# Patient Record
Sex: Male | Born: 1935 | Race: White | Hispanic: No | Marital: Married | Smoking: Former smoker
Health system: Southern US, Community
[De-identification: ages and names within clinical notes are randomized; demographics above are authoritative.]

## PROBLEM LIST (undated history)

## (undated) DIAGNOSIS — R0600 Dyspnea, unspecified: Secondary | ICD-10-CM

## (undated) DIAGNOSIS — R030 Elevated blood-pressure reading, without diagnosis of hypertension: Secondary | ICD-10-CM

## (undated) DIAGNOSIS — Z972 Presence of dental prosthetic device (complete) (partial): Secondary | ICD-10-CM

## (undated) DIAGNOSIS — R351 Nocturia: Secondary | ICD-10-CM

## (undated) DIAGNOSIS — Z9049 Acquired absence of other specified parts of digestive tract: Secondary | ICD-10-CM

## (undated) DIAGNOSIS — R197 Diarrhea, unspecified: Secondary | ICD-10-CM

## (undated) DIAGNOSIS — H9313 Tinnitus, bilateral: Secondary | ICD-10-CM

## (undated) DIAGNOSIS — J302 Other seasonal allergic rhinitis: Secondary | ICD-10-CM

## (undated) DIAGNOSIS — I2510 Atherosclerotic heart disease of native coronary artery without angina pectoris: Secondary | ICD-10-CM

## (undated) DIAGNOSIS — E785 Hyperlipidemia, unspecified: Secondary | ICD-10-CM

## (undated) DIAGNOSIS — C61 Malignant neoplasm of prostate: Secondary | ICD-10-CM

## (undated) DIAGNOSIS — I6523 Occlusion and stenosis of bilateral carotid arteries: Secondary | ICD-10-CM

## (undated) DIAGNOSIS — K9189 Other postprocedural complications and disorders of digestive system: Secondary | ICD-10-CM

## (undated) DIAGNOSIS — Z973 Presence of spectacles and contact lenses: Secondary | ICD-10-CM

## (undated) DIAGNOSIS — T8859XA Other complications of anesthesia, initial encounter: Secondary | ICD-10-CM

## (undated) DIAGNOSIS — N401 Enlarged prostate with lower urinary tract symptoms: Secondary | ICD-10-CM

## (undated) DIAGNOSIS — R011 Cardiac murmur, unspecified: Secondary | ICD-10-CM

## (undated) DIAGNOSIS — K219 Gastro-esophageal reflux disease without esophagitis: Secondary | ICD-10-CM

## (undated) DIAGNOSIS — N183 Chronic kidney disease, stage 3 unspecified: Secondary | ICD-10-CM

## (undated) DIAGNOSIS — Z8679 Personal history of other diseases of the circulatory system: Secondary | ICD-10-CM

## (undated) DIAGNOSIS — N529 Male erectile dysfunction, unspecified: Secondary | ICD-10-CM

## (undated) DIAGNOSIS — J189 Pneumonia, unspecified organism: Secondary | ICD-10-CM

## (undated) HISTORY — PX: COLONOSCOPY: SHX174

## (undated) HISTORY — PX: PROSTATE BIOPSY: SHX241

## (undated) HISTORY — DX: Chronic kidney disease, stage 3 (moderate): N18.3

## (undated) HISTORY — DX: Acquired absence of other specified parts of digestive tract: Z90.49

## (undated) HISTORY — DX: Chronic kidney disease, stage 3 unspecified: N18.30

## (undated) HISTORY — DX: Tinnitus, bilateral: H93.13

## (undated) HISTORY — DX: Male erectile dysfunction, unspecified: N52.9

## (undated) HISTORY — DX: Hyperlipidemia, unspecified: E78.5

## (undated) HISTORY — DX: Other seasonal allergic rhinitis: J30.2

## (undated) HISTORY — DX: Diarrhea, unspecified: R19.7

## (undated) HISTORY — PX: LAPAROSCOPIC CHOLECYSTECTOMY: SUR755

## (undated) HISTORY — DX: Other postprocedural complications and disorders of digestive system: K91.89

## (undated) HISTORY — PX: CATARACT EXTRACTION W/ INTRAOCULAR LENS  IMPLANT, BILATERAL: SHX1307

---

## 2003-01-18 ENCOUNTER — Encounter: Payer: Self-pay | Admitting: Emergency Medicine

## 2003-01-18 ENCOUNTER — Inpatient Hospital Stay (HOSPITAL_COMMUNITY): Admission: EM | Admit: 2003-01-18 | Discharge: 2003-01-19 | Payer: Self-pay | Admitting: Emergency Medicine

## 2003-01-18 ENCOUNTER — Encounter: Payer: Self-pay | Admitting: Surgery

## 2003-02-17 ENCOUNTER — Encounter: Payer: Self-pay | Admitting: Surgery

## 2003-02-17 ENCOUNTER — Ambulatory Visit (HOSPITAL_COMMUNITY): Admission: RE | Admit: 2003-02-17 | Discharge: 2003-02-17 | Payer: Self-pay | Admitting: Surgery

## 2011-11-23 ENCOUNTER — Other Ambulatory Visit: Payer: Self-pay | Admitting: Dermatology

## 2016-02-02 ENCOUNTER — Other Ambulatory Visit: Payer: Self-pay | Admitting: Internal Medicine

## 2016-02-02 DIAGNOSIS — R0989 Other specified symptoms and signs involving the circulatory and respiratory systems: Secondary | ICD-10-CM

## 2016-02-11 ENCOUNTER — Ambulatory Visit
Admission: RE | Admit: 2016-02-11 | Discharge: 2016-02-11 | Disposition: A | Discharge status: Home / Self Care | Payer: Medicare Other | Source: Ambulatory Visit | Attending: Internal Medicine | Admitting: Internal Medicine

## 2016-02-11 DIAGNOSIS — R0989 Other specified symptoms and signs involving the circulatory and respiratory systems: Secondary | ICD-10-CM

## 2016-11-17 ENCOUNTER — Other Ambulatory Visit: Payer: Self-pay | Admitting: Urology

## 2016-11-17 ENCOUNTER — Encounter: Payer: Self-pay | Admitting: Radiation Oncology

## 2016-11-17 DIAGNOSIS — C61 Malignant neoplasm of prostate: Secondary | ICD-10-CM

## 2016-11-24 ENCOUNTER — Telehealth: Payer: Self-pay | Admitting: Medical Oncology

## 2016-11-24 NOTE — Telephone Encounter (Signed)
Mrs. Cederberg called 11/23/16 with questions about treatment for prostate cancer, speaking with Luberta Mutter. I was out of the office and I returned the call today. Her husband has been diagnosed with prostate cancer and referred to Dr. Tammi Klippel. Her husband is scheduled for a PET scan and he is very anxious of the unknown. She drove to [facility] radiation department to ask about the scan and was able to tour the scanner. She states after seeing the scanner, she feels he will be able to proceed with the PET. She had questions about gold marker placement and radiation. All questions were answered and I asked her to call me with further concerns or questions. She voiced understanding.

## 2016-12-06 ENCOUNTER — Encounter (HOSPITAL_COMMUNITY)
Admission: RE | Admit: 2016-12-06 | Discharge: 2016-12-06 | Disposition: A | Discharge status: Home / Self Care | Payer: Medicare Other | Source: Ambulatory Visit | Attending: Urology | Admitting: Urology

## 2016-12-06 ENCOUNTER — Encounter: Payer: Self-pay | Admitting: Radiation Oncology

## 2016-12-06 DIAGNOSIS — C61 Malignant neoplasm of prostate: Secondary | ICD-10-CM | POA: Diagnosis not present

## 2016-12-06 MED ORDER — AXUMIN (FLUCICLOVINE F 18) INJECTION
9.9000 | Freq: Once | INTRAVENOUS | Status: AC
  Administered 2016-12-06: 9.9 via INTRAVENOUS

## 2016-12-06 NOTE — Progress Notes (Signed)
GU Location of Tumor / Histology: prostatic adenocarcinoma  If Prostate Cancer, Gleason Score is (4 + 3) and PSA is (10.1). Prostate volume: 98 grams.   Edrees Valent was referred by Dr. Lavone Orn to Dr. Irine Seal for evaluation of an elevated PSA. His last PSA was performed 01/28/16. The last PSA value was 7.26. Patient reports that Dr. Lavone Orn noted his PSA to be elevated 4 years ago (3.5).   Biopsies of prostate (if applicable) revealed:    Past/Anticipated interventions by urology, if any: prostate biopsy, referral to Dr. Tammi Klippel to discuss EBRT, discussion about ADT (MM to make final decision)  Past/Anticipated interventions by medical oncology, if any: no  Weight changes, if any: no  Bowel/Bladder complaints, if any: IPSS 9. Denies dysuria, hematuria, or leakage  Nausea/Vomiting, if any: no  Pain issues, if any:  Statins cause achy muscles  SAFETY ISSUES:  Prior radiation? no  Pacemaker/ICD? no  Possible current pregnancy? no  Is the patient on methotrexate? no  Current Complaints / other details:  83 year old male. Married. Retired. Lives in [place].

## 2016-12-08 ENCOUNTER — Encounter: Payer: Self-pay | Admitting: Medical Oncology

## 2016-12-08 ENCOUNTER — Ambulatory Visit
Admission: RE | Admit: 2016-12-08 | Discharge: 2016-12-08 | Disposition: A | Discharge status: Home / Self Care | Payer: Medicare Other | Source: Ambulatory Visit | Attending: Radiation Oncology | Admitting: Radiation Oncology

## 2016-12-08 ENCOUNTER — Encounter: Payer: Self-pay | Admitting: Radiation Oncology

## 2016-12-08 VITALS — BP 161/96 | HR 57 | Temp 98.0°F | Resp 16 | Ht 71.0 in | Wt 178.7 lb

## 2016-12-08 DIAGNOSIS — N183 Chronic kidney disease, stage 3 (moderate): Secondary | ICD-10-CM | POA: Diagnosis not present

## 2016-12-08 DIAGNOSIS — I129 Hypertensive chronic kidney disease with stage 1 through stage 4 chronic kidney disease, or unspecified chronic kidney disease: Secondary | ICD-10-CM | POA: Diagnosis not present

## 2016-12-08 DIAGNOSIS — Z87891 Personal history of nicotine dependence: Secondary | ICD-10-CM | POA: Insufficient documentation

## 2016-12-08 DIAGNOSIS — R197 Diarrhea, unspecified: Secondary | ICD-10-CM | POA: Diagnosis not present

## 2016-12-08 DIAGNOSIS — K219 Gastro-esophageal reflux disease without esophagitis: Secondary | ICD-10-CM | POA: Diagnosis not present

## 2016-12-08 DIAGNOSIS — Z510 Encounter for antineoplastic radiation therapy: Secondary | ICD-10-CM | POA: Diagnosis present

## 2016-12-08 DIAGNOSIS — N529 Male erectile dysfunction, unspecified: Secondary | ICD-10-CM | POA: Insufficient documentation

## 2016-12-08 DIAGNOSIS — E785 Hyperlipidemia, unspecified: Secondary | ICD-10-CM | POA: Diagnosis not present

## 2016-12-08 DIAGNOSIS — C61 Malignant neoplasm of prostate: Secondary | ICD-10-CM | POA: Insufficient documentation

## 2016-12-08 DIAGNOSIS — K5730 Diverticulosis of large intestine without perforation or abscess without bleeding: Secondary | ICD-10-CM | POA: Insufficient documentation

## 2016-12-08 HISTORY — DX: Malignant neoplasm of prostate: C61

## 2016-12-08 NOTE — Addendum Note (Signed)
Encounter addended by: Heywood Footman, RN on: 12/08/2016 11:04 AM<BR>    Actions taken: Sign clinical note

## 2016-12-08 NOTE — Progress Notes (Signed)
See progress note under physician encounter. 

## 2016-12-08 NOTE — Progress Notes (Signed)
Introduced myself to Mr. Rodney Patterson and his family as the prostate nurse navigator and my role. I had spoke with [name] in July concerning questions about the PET and treatments. He is here today to discuss his radiation treatment options with Dr. Tammi Klippel. I will continue to follow and encouraged them to call with questions or concerns.

## 2016-12-08 NOTE — Progress Notes (Signed)
Radiation Oncology         [phone] ________________________________  Initial outpatient Consultation  Name: Rodney Patterson MRN: [id number]  Date: 12/08/2016  DOB: Dec 30, 1932  QI:[id number], Jenny Reichmann, MD  Irine Seal, MD   REFERRING PHYSICIAN: Irine Seal, MD  DIAGNOSIS: 83 y.o. gentleman with Stage cT2a adenocarcinoma of the prostate with Gleason Score of 4+3, and PSA of 10.1.     ICD-10-CM   1. Malignant neoplasm of prostate ([place]) C61     HISTORY OF PRESENT ILLNESS: Rodney Patterson is a 83 y.o. male with a diagnosis of prostate cancer. He is an established patient of Dr. Jeffie Pollock with a longstanding history of elevated PSA and enlarged prostate. His most recent PSA was 10.1 on 08/29/2016. He was evaluated in urology by Dr. Jeffie Pollock on 09/07/2016,  digital rectal examination was performed at that time revealing prostate 3+ size and left lobe firm.  Prior DREs had been unremarkable. The patient proceeded to transrectal ultrasound with 12 biopsies of the prostate on 10/26/2016.  The prostate volume measured 98 cc.  Out of 12 core biopsies, 3 were positive.  The maximum Gleason score was 4+3, and this was seen in left base lateral and right mid lateral. There was also 3+4 disease in the right base lateral.  PSA trend: 10/10/2012 - 6.55 04/15/2013 - 5.69 04/21/2014 - 5.96 05/26/2015 - 7.66 08/25/2015 - 8.06 10/19/2015 - 1.83 01/28/2016 - 7.26 08/29/2016 - 10.10  Axumin PET on 12/06/2016 showed no evidence of prostate cancer nodal metastasis in the pelvis or metastatic disease outside the pelvis. Radiotracer accumulation within the prostate gland is likely a combination of prostate carcinoma and nodular hypertrophy. No evidence of skeletal metastasis. Prostate gland measures 5.5 x 7.9 x 5.9 cm (volume = 130 cm^3).  The patient reviewed the biopsy results with his urologist and he has kindly been referred today for discussion of potential radiation treatment options.  He presents today with his wife and  son.    PREVIOUS RADIATION THERAPY: No  PAST MEDICAL HISTORY:  Past Medical History:  Diagnosis Date  . Bilateral tinnitus   . CKD (chronic kidney disease) stage 3, GFR 30-59 ml/min   . Erectile dysfunction   . GERD (gastroesophageal reflux disease)   . Hyperlipidemia   . Hypertension   . Postcholecystectomy diarrhea   . Prostate cancer ([place])   . Seasonal allergic rhinitis       PAST SURGICAL HISTORY: Past Surgical History:  Procedure Laterality Date  . PROSTATE BIOPSY      FAMILY HISTORY:  Family History  Problem Relation Age of Onset  . Cancer Neg Hx     SOCIAL HISTORY:  Social History   Social History  . Marital status: Married    Spouse name: N/A  . Number of children: N/A  . Years of education: N/A   Occupational History  . Not on file.   Social History Main Topics  . Smoking status: Former Smoker    Years: 10.00    Types: Cigars, Pipe    Quit date: 05/03/1967  . Smokeless tobacco: Never Used  . Alcohol use Yes     Comment: social  . Drug use: No  . Sexual activity: Yes   Other Topics Concern  . Not on file   Social History Narrative  . No narrative on file   He is retired and lives in [place].  ALLERGIES: Tetanus immune globulin  MEDICATIONS:  Current Outpatient Prescriptions  Medication Sig Dispense Refill  . aspirin 81 MG tablet  Take 81 mg by mouth daily.    . Flaxseed, Linseed, (FLAX SEED OIL) 1000 MG CAPS Take by mouth. 1 tab daily    . Multiple Vitamin (MULTIVITAMIN) tablet Take 1 tablet by mouth daily.    . Omega-3 Fatty Acids (FISH OIL) 1000 MG CAPS Take by mouth.    . rosuvastatin (CRESTOR) 5 MG tablet Take 5 mg by mouth daily.    . tadalafil (CIALIS) 20 MG tablet Take 20 mg by mouth daily as needed for erectile dysfunction.    Marland Kitchen VITAMIN D, ERGOCALCIFEROL, PO Take by mouth.     No current facility-administered medications for this encounter.     REVIEW OF SYSTEMS:  On review of systems, the patient reports that he is doing  well overall. He denies any chest pain, shortness of breath, cough, fevers, chills, night sweats, unintended weight changes. He denies any bowel disturbances, and denies abdominal pain, nausea or vomiting. He reports statins cause achy muscles. His IPSS was 9, indicating moderate urinary symptoms. He denies dysuria, hematuria, or leakage. He is not currently taking any medications for urinary symptoms. He previously took a medication which he believes was to decrease the size of the prostate but, it affected the ability of his eyes dilating so it was discontinued. He is not to complete sexual activity with most attempts. A complete review of systems is obtained and is otherwise negative.    PHYSICAL EXAM:  Wt Readings from Last 3 Encounters:  12/08/16 178 lb 11.2 oz (81.1 kg)   Temp Readings from Last 3 Encounters:  12/08/16 98 F (36.7 C)   BP Readings from Last 3 Encounters:  12/08/16 (!) 161/96   Pulse Readings from Last 3 Encounters:  12/08/16 (!) 57   Pain Assessment Pain Score: 0-No pain/10  In general this is a well appearing caucasian male in no acute distress. He is alert and oriented x4 and appropriate throughout the examination. HEENT reveals that the patient is normocephalic, atraumatic. EOMs are intact. PERRLA. Skin is intact without any evidence of gross lesions. Cardiovascular exam reveals a regular rate and rhythm, no clicks rubs or murmurs are auscultated. Chest is clear to auscultation bilaterally. Lymphatic assessment is performed and does not reveal any adenopathy in the cervical, supraclavicular, axillary, or inguinal chains. Abdomen has active bowel sounds in all quadrants and is intact. The abdomen is soft, non tender, non distended. Lower extremities are negative for pretibial pitting edema, deep calf tenderness, cyanosis or clubbing.   KPS = 100  100 - Normal; no complaints; no evidence of disease. 90   - Able to carry on normal activity; minor signs or symptoms of  disease. 80   - Normal activity with effort; some signs or symptoms of disease. 44   - Cares for self; unable to carry on normal activity or to do active work. 60   - Requires occasional assistance, but is able to care for most of his personal needs. 50   - Requires considerable assistance and frequent medical care. 7   - Disabled; requires special care and assistance. 53   - Severely disabled; hospital admission is indicated although death not imminent. 43   - Very sick; hospital admission necessary; active supportive treatment necessary. 10   - Moribund; fatal processes progressing rapidly. 0     - Dead  Karnofsky DA, Abelmann WH, Craver LS and Burchenal [facility] [phone]) The use of the nitrogen mustards in the palliative treatment of carcinoma: with particular reference to bronchogenic carcinoma Cancer  1 [id number]  LABORATORY DATA:  No results found for: WBC, HGB, HCT, MCV, PLT No results found for: NA, K, CL, CO2 No results found for: ALT, AST, GGT, ALKPHOS, BILITOT   RADIOGRAPHY: Nm Pet (axumin) Skull Base To Mid Thigh  Result Date: 12/06/2016 CLINICAL DATA:  Prostate carcinoma with biochemical recurrence. Gleason 7 disease. PSA equal 10.1 = 08/29/2016 EXAM: NUCLEAR MEDICINE PET SKULL BASE TO THIGH TECHNIQUE: 9.9 mCi F-18 Fluciclovine was injected intravenously. Full-ring PET imaging was performed from the skull base to thigh after the radiotracer. CT data was obtained and used for attenuation correction and anatomic localization. COMPARISON:  None. FINDINGS: NECK No radiotracer activity in neck lymph nodes. CHEST No radiotracer accumulation within mediastinal or hilar lymph nodes. 10 mm nodule along the RIGHT oblique fissure without radiotracer activity ABDOMEN/PELVIS Diffuse moderate activity within the prostate gland with SUV max equal 5.8. There are several discrete foci of more intense activity within the RIGHT peripheral zone (mid gland), LEFT peripheral zone (lateral mid gland) and transitional  zone. Prostate gland measures 5.5 x 7.9 x 5.9 cm (volume = 130 cm^3). No radiotracer activity within pelvic lymph nodes. No radiotracer activity in periaortic lymph nodes. No enlarged lymph nodes identified. Physiologic activity noted within the liver and pancreas. Sigmoid diverticulosis noted. Atherosclerotic calcification. Post cholecystectomy SKELETON No focal  activity to suggest skeletal metastasis. IMPRESSION: 1. No evidence of prostate cancer nodal metastasis in the pelvis. 2. No evidence of metastatic disease outside the pelvis. 3. Radiotracer accumulation within the prostate gland is likely a combination of prostate carcinoma and nodular hypertrophy. 4. No evidence of skeletal metastasis Electronically Signed   By: Suzy Bouchard M.D.   On: 12/06/2016 15:30      IMPRESSION/PLAN: 1. 83 y.o. gentleman with Stage cT2a adenocarcinoma of the prostate with Gleason Score of 4+3, and PSA of 10.1.  We reviewed the findings and workup thus far.  We discussed the natural history of prostate cancer.  We reviewed the the implications of T-stage, Gleason's Score, and PSA on decision-making and outcomes in prostate cancer.  We discussed radiation treatment in the management of prostate cancer with regard to the logistics and delivery of external beam radiation treatment as well as the logistics and delivery of prostate brachytherapy and androgen deprivation therapy.  We compared and contrasted each of these approaches and also compared these against prostatectomy. The patient is not an ideal candidate for brachytherapy due to his prostate size of 130 cc and is not a surgical candidate due to his age. The patient would like to proceed with prostate IMRT but is not interested in androgen deprivation therapy at this time.    We will share our findings with Dr. Jeffie Pollock and move forward with scheduling placement of three gold fiducial markers into the prostate to proceed with IMRT in the near future. We also discussed  the potential for SpaceOar to be used in this patient's case to decrease rectal side effects especially in light of the large prostate volume; we will talk with Dr. Jeffie Pollock about this possibility. The patient is interested in [place].  We spent 60 minutes minutes face to face with the patient and more than 50% of that time was spent in counseling and/or coordination of care.      Nicholos Johns, PA-C    [name], MD  [facility] Direct Dial: [phone]  Fax: [phone] [email or web address]  Skype  LinkedIn  This document serves as a record of services personally performed by  [name], MD and Freeman Caldron, PA-C. It was created on their behalf by Arlyce Harman, a trained medical scribe. The creation of this record is based on the scribe's personal observations and the provider's statements to them. This document has been checked and approved by the attending provider.

## 2016-12-13 ENCOUNTER — Other Ambulatory Visit: Payer: Self-pay | Admitting: Urology

## 2016-12-13 ENCOUNTER — Telehealth: Payer: Self-pay | Admitting: Urology

## 2016-12-13 NOTE — Telephone Encounter (Signed)
I spoke with [name] regarding her husband, Rodney Patterson this morning and she reports that they are interested in moving forward with scheduling outpatient surgery for fiducial markers and SpaceOAR implant with Dr. Jeffie Pollock, first available in preparation for beginning prostate IMRT in the near future.  I have sent a message to Romie Jumper to contact [name] on her mobile number [phone]) to begin coordinating these appointments.  They prefer to have the procedure, early morning on a Wednesday if possible.  This has been relayed to Romie Jumper who will reach out to the patient and assist with coordinating with Dr. Ralene Muskrat office.

## 2016-12-14 ENCOUNTER — Other Ambulatory Visit: Payer: Self-pay | Admitting: Urology

## 2016-12-14 ENCOUNTER — Telehealth: Payer: Self-pay | Admitting: *Deleted

## 2016-12-14 DIAGNOSIS — C61 Malignant neoplasm of prostate: Secondary | ICD-10-CM

## 2016-12-14 NOTE — Telephone Encounter (Signed)
Called patient to inform of  Procedure on 12-27-16 @ 11 am @ [facility], and his sim on 12-29-16 @ 1 pm and his MRI on 12-29-16 @ 3 pm @ [facility], spoke with patient and he is aware of these appts.

## 2016-12-16 ENCOUNTER — Encounter (HOSPITAL_BASED_OUTPATIENT_CLINIC_OR_DEPARTMENT_OTHER): Payer: Self-pay | Admitting: *Deleted

## 2016-12-16 NOTE — Progress Notes (Signed)
NPO AFTER MN.  ARRIVE AT 0945.  NEEDS ISTAT 8. 

## 2016-12-26 NOTE — H&P (Signed)
CC/HPI: I have prostate cancer.     Rodney Patterson returns today in f/u from a prostate biopsy for a rising PSA of 10.1 and left prostate firmness. He was found to have a T2 Nx Mx Gleason 7(4+3) with the 7(4+3) in 2 cores with 90% and 50% involvement and Gleason 7(3+4) in 1 core with 90% involvement. [facility] nomogram predicts 35% OCD 62% ECE, 10% LNI and 7% SVI. The prostate volume was 98gm. IPSS is 9 and SHIM is 4.     December 20, 2016: He is scheduled for placement of fiducial markers and Space oar on 8/28. He is here today for pre-op H&P. No new changes in voiding or constitutional symptoms. No recent fevers or abx treatment for infection.    ALLERGIES: alfuzosin Bees Tetanus    MEDICATIONS: Aspirin 81 MG TABS Oral  Crestor 5 MG Oral Tablet Oral  Fish Oil CAPS Oral  Flaxseed Oil CAPS Oral  Multi-Vitamin TABS Oral  Vitamin D3 CAPS Oral     GU PSH: Prostate Needle Biopsy - 10/26/2016      PSH Notes: Cholecystectomy   NON-GU PSH: Cataract Surgery.. Cholecystectomy (open) - 2010 Surgical Pathology, Gross And Microscopic Examination For Prostate Needle - 10/26/2016    GU PMH: Prostate Cancer, T2a Nx Mx gleason 7(4+3) prostate cancer with a 42ml prostate and minimal LUTS. I have reviewed the options for therapy including Active surveillance, RALP and EXRT. His prostate is too large for seeds, cryo or HIFU and I think his tumor grade is too high for those as well. He has minimal comorbidities and a reasonable 10 year life expectancy and I think EXRT would be the most appropriate active therapy. He does have a 10% risk of LNI so I am going to request and Axumin PET for staging. I will get him set up to see Dr. Tammi Klippel and discussed adjuvant ADT but will defer a decision on that to Dr. Tammi Klippel. - 11/16/2016 Elevated PSA - 10/26/2016, (Worsening), His PSA continues to rise and he now has firmness of the left lateral prostate. I reviewed the indications for a biopsy and he is willing to proceed. I  reviewed the risks of bleeding, infection and voiding difficulty. , - 09/07/2016, - 02/08/2016 (Improving), His PSA was up to 8 in April but is now down to 1.83. His prostate is large but benign., - 10/22/2015, Elevated prostate specific antigen (PSA), - 2017 Prostate nodule w/ LUTS - 09/07/2016 ED due to arterial insufficiency (Stable) - 10/22/2015, Erectile dysfunction due to arterial insufficiency, - 2017 BPH w/LUTS, Benign prostatic hyperplasia with urinary obstruction - 2017 Nocturia, Nocturia - 2017 Weak Urinary Stream, Weak urinary stream - 2014    NON-GU PMH: Encounter for general adult medical examination without abnormal findings, Encounter for preventive health examination - 2016 Decreased libido, Libido, Decreased - 2014 Personal history of other diseases of the circulatory system, History of hypertension - 2014 Personal history of other infectious and parasitic diseases, History of hepatitis - 2014    FAMILY HISTORY: Acute Myocardial Infarction - Mother Death In The Family Father - Father Death In The Family Mother - Mother Family Health Status Number - Runs In Family   SOCIAL HISTORY: Marital Status: Married Preferred Language: English; Race: White Current Smoking Status: Patient does not smoke anymore.  Does drink.  Drinks 3 caffeinated drinks per day. Patient's occupation is/was retired.     Notes: Caffeine Use, Previous History Of Smoking, Marital History - Currently Married, Being A Economist, Retired From Work  REVIEW OF SYSTEMS:    GU Review Male:   Patient reports frequent urination, get up at night to urinate, and erection problems. Patient denies hard to postpone urination, burning/ pain with urination, leakage of urine, stream starts and stops, trouble starting your stream, have to strain to urinate , and penile pain.  Gastrointestinal (Upper):   Patient denies nausea, vomiting, and indigestion/ heartburn.  Gastrointestinal (Lower):   Patient denies diarrhea and  constipation.  Constitutional:   Patient denies fever, night sweats, weight loss, and fatigue.  Skin:   Patient reports skin rash/ lesion and itching.   Eyes:   Patient denies blurred vision and double vision.  Ears/ Nose/ Throat:   Patient denies sore throat and sinus problems.  Hematologic/Lymphatic:   Patient denies swollen glands and easy bruising.  Cardiovascular:   Patient denies leg swelling and chest pains.  Respiratory:   Patient denies cough and shortness of breath.  Endocrine:   Patient denies excessive thirst.  Musculoskeletal:   Patient denies back pain and joint pain.  Neurological:   Patient denies headaches and dizziness.  Psychologic:   Patient denies depression and anxiety.   VITAL SIGNS:      12/20/2016 09:34 AM  Weight 178 lb / 80.74 kg  Height 71 in / 180.34 cm  BP 150/86 mmHg  Heart Rate 52 /min  BMI 24.8 kg/m   MULTI-SYSTEM PHYSICAL EXAMINATION:    Constitutional: Well-nourished. No physical deformities. Normally developed. Good grooming.  Respiratory: No labored breathing, no use of accessory muscles. CTA.  Cardiovascular: Heart murmur. Normal temperature, normal extremity pulses, no swelling, no varicosities. RRR.  Skin: No paleness, no jaundice, no cyanosis. No lesion, no ulcer, no rash.  Neurologic / Psychiatric: Oriented to time, oriented to place, oriented to person. No depression, no anxiety, no agitation.  Gastrointestinal: No mass, no tenderness, no rigidity, non obese abdomen.     PAST DATA REVIEWED:  Source Of History:  Patient  Records Review:   Pathology Reports, Previous Patient Records  Urine Test Review:   Urinalysis   08/29/16 01/28/16 10/19/15 08/25/15 05/26/15 04/21/14 04/15/13 10/10/12  PSA  Total PSA 10.10 ng/dl 7.26  1.83  8.06  7.66  5.96  5.69  6.55   Free PSA  1.97   2.13  2.03  1.79  1.71  1.87   % Free PSA  27   26  27  30  30  29      04/02/10  Hormones  Testosterone, Total 711.04     12/20/16  Urinalysis  Urine  Appearance Clear   Urine Color Amber   Urine Glucose Neg   Urine Bilirubin Neg   Urine Ketones Neg   Urine Specific Gravity 1.025   Urine Blood Neg   Urine pH 5.5   Urine Protein Trace   Urine Urobilinogen 1.0   Urine Nitrites Neg   Urine Leukocyte Esterase Neg    PROCEDURES:          Urinalysis Dipstick Dipstick Cont'd  Color: Amber Bilirubin: Neg  Appearance: Clear Ketones: Neg  Specific Gravity: 1.025 Blood: Neg  pH: 5.5 Protein: Trace  Glucose: Neg Urobilinogen: 1.0    Nitrites: Neg    Leukocyte Esterase: Neg    ASSESSMENT:      ICD-10 Details  1 GU:   Prostate Cancer - C61   2   BPH w/LUTS - N40.1    PLAN:           Orders Labs Urine Culture  Schedule Return Visit/Planned Activity: Keep Scheduled Appointment - Schedule Surgery          Document Letter(s):  Created for Patient: Clinical Summary         Notes:   No obvious abnormalities on PE to prevent pt from undergoing procedure next week. All questions answered to best of my ability with understanding expressed by patient. Urine for c/s to serve as baseline prior to his procedure.

## 2016-12-27 ENCOUNTER — Encounter (HOSPITAL_BASED_OUTPATIENT_CLINIC_OR_DEPARTMENT_OTHER): Payer: Self-pay | Admitting: *Deleted

## 2016-12-27 ENCOUNTER — Ambulatory Visit (HOSPITAL_COMMUNITY): Payer: Medicare Other

## 2016-12-27 ENCOUNTER — Ambulatory Visit (HOSPITAL_BASED_OUTPATIENT_CLINIC_OR_DEPARTMENT_OTHER): Payer: Medicare Other | Admitting: Anesthesiology

## 2016-12-27 ENCOUNTER — Other Ambulatory Visit: Payer: Self-pay

## 2016-12-27 ENCOUNTER — Ambulatory Visit (HOSPITAL_BASED_OUTPATIENT_CLINIC_OR_DEPARTMENT_OTHER)
Admission: RE | Admit: 2016-12-27 | Discharge: 2016-12-27 | Disposition: A | Discharge status: Home / Self Care | Payer: Medicare Other | Source: Ambulatory Visit | Attending: Urology | Admitting: Urology

## 2016-12-27 ENCOUNTER — Encounter (HOSPITAL_BASED_OUTPATIENT_CLINIC_OR_DEPARTMENT_OTHER)
Admission: RE | Disposition: A | Discharge status: Home / Self Care | Payer: Self-pay | Source: Ambulatory Visit | Attending: Urology

## 2016-12-27 ENCOUNTER — Telehealth: Payer: Self-pay | Admitting: *Deleted

## 2016-12-27 DIAGNOSIS — Z79899 Other long term (current) drug therapy: Secondary | ICD-10-CM | POA: Insufficient documentation

## 2016-12-27 DIAGNOSIS — Z87891 Personal history of nicotine dependence: Secondary | ICD-10-CM | POA: Diagnosis not present

## 2016-12-27 DIAGNOSIS — C61 Malignant neoplasm of prostate: Secondary | ICD-10-CM | POA: Insufficient documentation

## 2016-12-27 DIAGNOSIS — Z7982 Long term (current) use of aspirin: Secondary | ICD-10-CM | POA: Diagnosis not present

## 2016-12-27 DIAGNOSIS — R351 Nocturia: Secondary | ICD-10-CM | POA: Diagnosis not present

## 2016-12-27 DIAGNOSIS — N401 Enlarged prostate with lower urinary tract symptoms: Secondary | ICD-10-CM | POA: Insufficient documentation

## 2016-12-27 DIAGNOSIS — R3912 Poor urinary stream: Secondary | ICD-10-CM | POA: Diagnosis not present

## 2016-12-27 HISTORY — DX: Elevated blood-pressure reading, without diagnosis of hypertension: R03.0

## 2016-12-27 HISTORY — DX: Gastro-esophageal reflux disease without esophagitis: K21.9

## 2016-12-27 HISTORY — PX: GOLD SEED IMPLANT: SHX6343

## 2016-12-27 HISTORY — DX: Presence of dental prosthetic device (complete) (partial): Z97.2

## 2016-12-27 HISTORY — DX: Occlusion and stenosis of bilateral carotid arteries: I65.23

## 2016-12-27 HISTORY — DX: Benign prostatic hyperplasia with lower urinary tract symptoms: N40.1

## 2016-12-27 HISTORY — DX: Personal history of other diseases of the circulatory system: Z86.79

## 2016-12-27 HISTORY — DX: Presence of spectacles and contact lenses: Z97.3

## 2016-12-27 HISTORY — DX: Nocturia: R35.1

## 2016-12-27 LAB — POCT I-STAT, CHEM 8
BUN: 17 mg/dL (ref 6–20)
Calcium, Ion: 1.19 mmol/L (ref 1.15–1.40)
Chloride: 103 mmol/L (ref 101–111)
Creatinine, Ser: 1.4 mg/dL — ABNORMAL HIGH (ref 0.61–1.24)
Glucose, Bld: 93 mg/dL (ref 65–99)
HEMATOCRIT: 43 % (ref 39.0–52.0)
HEMOGLOBIN: 14.6 g/dL (ref 13.0–17.0)
Potassium: 4 mmol/L (ref 3.5–5.1)
SODIUM: 143 mmol/L (ref 135–145)
TCO2: 27 mmol/L (ref 22–32)

## 2016-12-27 SURGERY — INSERTION, GOLD SEEDS
Anesthesia: General | Site: Prostate

## 2016-12-27 MED ORDER — ONDANSETRON HCL 4 MG/2ML IJ SOLN
INTRAMUSCULAR | Status: DC | PRN
  Administered 2016-12-27: 4 mg via INTRAVENOUS

## 2016-12-27 MED ORDER — MORPHINE SULFATE (PF) 2 MG/ML IV SOLN
1.0000 mg | INTRAVENOUS | Status: DC | PRN
  Filled 2016-12-27: qty 0.5

## 2016-12-27 MED ORDER — CEFTRIAXONE SODIUM 2 G IJ SOLR
INTRAMUSCULAR | Status: AC
  Filled 2016-12-27: qty 2

## 2016-12-27 MED ORDER — SODIUM CHLORIDE 0.9% FLUSH
3.0000 mL | Freq: Two times a day (BID) | INTRAVENOUS | Status: DC
  Filled 2016-12-27: qty 3

## 2016-12-27 MED ORDER — FENTANYL CITRATE (PF) 100 MCG/2ML IJ SOLN
INTRAMUSCULAR | Status: AC
  Filled 2016-12-27: qty 2

## 2016-12-27 MED ORDER — DEXTROSE 5 % IV SOLN
2.0000 g | INTRAVENOUS | Status: AC
  Administered 2016-12-27: 2 g via INTRAVENOUS
  Filled 2016-12-27: qty 2

## 2016-12-27 MED ORDER — FENTANYL CITRATE (PF) 100 MCG/2ML IJ SOLN
INTRAMUSCULAR | Status: DC | PRN
  Administered 2016-12-27: 25 ug via INTRAVENOUS

## 2016-12-27 MED ORDER — DEXAMETHASONE SODIUM PHOSPHATE 4 MG/ML IJ SOLN
INTRAMUSCULAR | Status: DC | PRN
  Administered 2016-12-27: 5 mg via INTRAVENOUS

## 2016-12-27 MED ORDER — FENTANYL CITRATE (PF) 100 MCG/2ML IJ SOLN
25.0000 ug | INTRAMUSCULAR | Status: DC | PRN
  Filled 2016-12-27: qty 1

## 2016-12-27 MED ORDER — DEXTROSE 5 % IV SOLN
INTRAVENOUS | Status: AC
  Filled 2016-12-27: qty 50

## 2016-12-27 MED ORDER — ACETAMINOPHEN 650 MG RE SUPP
650.0000 mg | RECTAL | Status: DC | PRN
  Filled 2016-12-27: qty 1

## 2016-12-27 MED ORDER — TRAMADOL HCL 50 MG PO TABS: 50.0000 mg | ORAL_TABLET | Freq: Four times a day (QID) | ORAL | 0 refills | Status: DC | PRN

## 2016-12-27 MED ORDER — PROMETHAZINE HCL 25 MG/ML IJ SOLN
6.2500 mg | INTRAMUSCULAR | Status: DC | PRN
  Filled 2016-12-27: qty 1

## 2016-12-27 MED ORDER — LIDOCAINE 2% (20 MG/ML) 5 ML SYRINGE
INTRAMUSCULAR | Status: DC | PRN
  Administered 2016-12-27: 60 mg via INTRAVENOUS

## 2016-12-27 MED ORDER — ACETAMINOPHEN 325 MG PO TABS
650.0000 mg | ORAL_TABLET | ORAL | Status: DC | PRN
  Filled 2016-12-27: qty 2

## 2016-12-27 MED ORDER — GENTAMICIN SULFATE 40 MG/ML IJ SOLN
5.0000 mg/kg | INTRAVENOUS | Status: DC
  Filled 2016-12-27: qty 10.25

## 2016-12-27 MED ORDER — SODIUM CHLORIDE 0.9 % IV SOLN
250.0000 mL | INTRAVENOUS | Status: DC | PRN
  Filled 2016-12-27: qty 250

## 2016-12-27 MED ORDER — SODIUM CHLORIDE 0.9% FLUSH
3.0000 mL | INTRAVENOUS | Status: DC | PRN
  Filled 2016-12-27: qty 3

## 2016-12-27 MED ORDER — PROPOFOL 10 MG/ML IV BOLUS
INTRAVENOUS | Status: DC | PRN
  Administered 2016-12-27: 70 mg via INTRAVENOUS
  Administered 2016-12-27: 30 mg via INTRAVENOUS

## 2016-12-27 MED ORDER — SODIUM CHLORIDE 0.9 % IV SOLN
INTRAVENOUS | Status: DC
  Administered 2016-12-27: 10:00:00 via INTRAVENOUS
  Filled 2016-12-27: qty 1000

## 2016-12-27 MED ORDER — GENTAMICIN SULFATE 40 MG/ML IJ SOLN
400.0000 mg | INTRAVENOUS | Status: AC
  Administered 2016-12-27: 400 mg via INTRAVENOUS
  Filled 2016-12-27: qty 10

## 2016-12-27 SURGICAL SUPPLY — 21 items
DRSG TEGADERM 4X4.75 (GAUZE/BANDAGES/DRESSINGS) ×6 IMPLANT
DRSG TELFA 3X8 NADH (GAUZE/BANDAGES/DRESSINGS) ×3 IMPLANT
GAUZE SPONGE 4X4 12PLY STRL LF (GAUZE/BANDAGES/DRESSINGS) ×3 IMPLANT
GLOVE ECLIPSE 8.0 STRL XLNG CF (GLOVE) ×3 IMPLANT
GLOVE SURG SS PI 8.0 STRL IVOR (GLOVE) ×6 IMPLANT
GOWN STRL REUS W/TWL LRG LVL3 (GOWN DISPOSABLE) ×6 IMPLANT
IMPL SPACEOAR SYSTEM 10ML (MISCELLANEOUS) ×1 IMPLANT
IMPLANT SPACEOAR SYSTEM 10ML (MISCELLANEOUS) ×3
KIT RM TURNOVER CYSTO AR (KITS) ×3 IMPLANT
MARKER GOLD PRELOAD 1.2X3 (Urological Implant) ×1 IMPLANT
MARKER SKIN DUAL TIP RULER LAB (MISCELLANEOUS) ×3 IMPLANT
NDL SAFETY ECLIPSE 18X1.5 (NEEDLE) IMPLANT
NEEDLE HYPO 18GX1.5 SHARP (NEEDLE)
NEEDLE SPNL 22GX7 QUINCKE BK (NEEDLE) IMPLANT
SEED GOLD PRELOAD 1.2X3 (Urological Implant) ×3 IMPLANT
SURGILUBE 2OZ TUBE FLIPTOP (MISCELLANEOUS) ×3 IMPLANT
SYR 20CC LL (SYRINGE) IMPLANT
SYR CONTROL 10ML LL (SYRINGE) ×3 IMPLANT
TOWEL OR 17X24 6PK STRL BLUE (TOWEL DISPOSABLE) ×3 IMPLANT
UNDERPAD 30X30 INCONTINENT (UNDERPADS AND DIAPERS) ×6 IMPLANT
WATER STERILE IRR 500ML POUR (IV SOLUTION) ×3 IMPLANT

## 2016-12-27 NOTE — Anesthesia Procedure Notes (Signed)
Procedure Name: LMA Insertion Date/Time: 12/27/2016 12:04 PM Performed by: Wanita Chamberlain Pre-anesthesia Checklist: Patient identified, Emergency Drugs available, Suction available and Patient being monitored Patient Re-evaluated:Patient Re-evaluated prior to induction Oxygen Delivery Method: Circle system utilized Preoxygenation: Pre-oxygenation with 100% oxygen Induction Type: IV induction Ventilation: Mask ventilation without difficulty LMA: LMA inserted LMA Size: 4.0 Number of attempts: 1 Airway Equipment and Method: Bite block Placement Confirmation: breath sounds checked- equal and bilateral and positive ETCO2 Tube secured with: Tape Dental Injury: Teeth and Oropharynx as per pre-operative assessment

## 2016-12-27 NOTE — Interval H&P Note (Signed)
History and Physical Interval Note:  12/27/2016 11:17 AM  Rodney Patterson  has presented today for surgery, with the diagnosis of PROSTATE CANCER  The various methods of treatment have been discussed with the patient and family. After consideration of risks, benefits and other options for treatment, the patient has consented to  Procedure(s): GOLD SEED IMPLANT WITH SPACE OAR (N/A) as a surgical intervention .  The patient's history has been reviewed, patient examined, no change in status, stable for surgery.  I have reviewed the patient's chart and labs.  Questions were answered to the patient's satisfaction.     Kellen Dutch J

## 2016-12-27 NOTE — Transfer of Care (Signed)
Immediate Anesthesia Transfer of Care Note  Patient: Rodney Patterson  Procedure(s) Performed: Procedure(s): GOLD SEED IMPLANT WITH SPACE OAR (N/A)  Patient Location: PACU  Anesthesia Type:General  Level of Consciousness: awake, alert , oriented and patient cooperative  Airway & Oxygen Therapy: Patient Spontanous Breathing and Patient connected to nasal cannula oxygen  Post-op Assessment: Report given to RN and Post -op Vital signs reviewed and stable  Post vital signs: Reviewed and stable  Last Vitals:  Vitals:   12/27/16 0917 12/27/16 1245  BP: (!) 151/88 (!) 147/85  Pulse: (!) 48 (!) 50  Resp: 16 14  Temp: (!) 36.4 C (!) 36.4 C  SpO2: 100% 100%    Last Pain:  Vitals:   12/27/16 0937  TempSrc:   PainSc: 0-No pain      Patients Stated Pain Goal: 8 ([id number])  Complications: No apparent anesthesia complications

## 2016-12-27 NOTE — Anesthesia Preprocedure Evaluation (Addendum)
Anesthesia Evaluation  Patient identified by MRN, date of birth, ID band Patient awake    Reviewed: Allergy & Precautions, NPO status , Patient's Chart, lab work & pertinent test results  Airway Mallampati: II  TM Distance: >3 FB Neck ROM: Full    Dental no notable dental hx. (+) Teeth Intact, Dental Advisory Given, Caps, Partial Upper,    Pulmonary neg pulmonary ROS, former smoker,    Pulmonary exam normal breath sounds clear to auscultation       Cardiovascular + Peripheral Vascular Disease  Normal cardiovascular exam Rhythm:Regular Rate:Normal     Neuro/Psych negative neurological ROS  negative psych ROS   GI/Hepatic negative GI ROS, Neg liver ROS,   Endo/Other  negative endocrine ROS  Renal/GU Renal InsufficiencyRenal disease  negative genitourinary   Musculoskeletal negative musculoskeletal ROS (+)   Abdominal   Peds negative pediatric ROS (+)  Hematology negative hematology ROS (+)   Anesthesia Other Findings   Reproductive/Obstetrics negative OB ROS                            Anesthesia Physical Anesthesia Plan  ASA: II  Anesthesia Plan: General   Post-op Pain Management:    Induction: Intravenous  PONV Risk Score and Plan: 1 and Ondansetron and Dexamethasone  Airway Management Planned: LMA  Additional Equipment:   Intra-op Plan:   Post-operative Plan: Extubation in OR  Informed Consent: I have reviewed the patients History and Physical, chart, labs and discussed the procedure including the risks, benefits and alternatives for the proposed anesthesia with the patient or authorized representative who has indicated his/her understanding and acceptance.   Dental advisory given  Plan Discussed with: CRNA and Surgeon  Anesthesia Plan Comments:         Anesthesia Quick Evaluation

## 2016-12-27 NOTE — Brief Op Note (Signed)
12/27/2016  12:25 PM  PATIENT:  Rodney Patterson  83 y.o. male  PRE-OPERATIVE DIAGNOSIS:  PROSTATE CANCER  POST-OPERATIVE DIAGNOSIS:  PROSTATE CANCER  PROCEDURE:  Procedure(s): GOLD SEED IMPLANT WITH SPACE OAR (N/A)  SURGEON:  Surgeon(s) and Role:    * Irine Seal, MD - Primary    * [name], MD - Assisting  PHYSICIAN ASSISTANT:   ASSISTANTS: none   ANESTHESIA:   general  EBL:  No intake/output data recorded.  BLOOD ADMINISTERED:none  DRAINS: none   LOCAL MEDICATIONS USED:  NONE  SPECIMEN:  No Specimen  DISPOSITION OF SPECIMEN:  N/A  COUNTS:  YES  TOURNIQUET:  * No tourniquets in log *  DICTATION: .Other Dictation: Dictation Number [id number]  PLAN OF CARE: Discharge to home after PACU  PATIENT DISPOSITION:  PACU - hemodynamically stable.   Delay start of Pharmacological VTE agent (>24hrs) due to surgical blood loss or risk of bleeding: not applicable

## 2016-12-27 NOTE — Telephone Encounter (Signed)
Called patient to inform of sim and MRI, lvm for a return call 

## 2016-12-27 NOTE — Discharge Instructions (Addendum)
Gold Seed Implant Home Care Instructions   Activity:    Rest for the remainder of the day.  Do not drive or operate equipment today.  You may resume normal  activities in a few days as instructed by your physician, without risk of harmful radiation exposure to those around you, provided you follow the time and distance precautions on the Radiation Oncology Instruction Sheet.   Meals: Drink plenty of lipuids and eat light foods, such as gelatin or soup this evening .  You may return to normal meal plan tomorrow.  Return To Work: You may return to work as instructed by Naval architect.  Special Instruction:   If any seeds are found, use tweezers to pick up seeds and place in a glass container of any kind and bring to your physician's office.  Call your physician if any of these symptoms occur:   Persistent or heavy bleeding  Urine stream diminishes or stops completely after catheter is removed  Fever equal to or greater than 101 degrees F  Cloudy urine with a strong foul odor  Severe pain  You may feel some burning pain and/or hesitancy when you urinate after the catheter is removed.  These symptoms may increase over the next few weeks, but should diminish within forur to six weeks.  Applying moist heat to the lower abdomen or a hot tub bath may help relieve the pain.  If the discomfort becomes severe, please call your physician for additional medications.    Post Anesthesia Home Care Instructions  Activity: Get plenty of rest for the remainder of the day. A responsible individual must stay with you for 24 hours following the procedure.  For the next 24 hours, DO NOT: -Drive a car -Paediatric nurse -Drink alcoholic beverages -Take any medication unless instructed by your physician -Make any legal decisions or sign important papers.  Meals: Start with liquid foods such as gelatin or soup. Progress to regular foods as tolerated. Avoid greasy, spicy, heavy foods. If nausea  and/or vomiting occur, drink only clear liquids until the nausea and/or vomiting subsides. Call your physician if vomiting continues.  Special Instructions/Symptoms: Your throat may feel dry or sore from the anesthesia or the breathing tube placed in your throat during surgery. If this causes discomfort, gargle with warm salt water. The discomfort should disappear within 24 hours.  If you had a scopolamine patch placed behind your ear for the management of post- operative nausea and/or vomiting:  1. The medication in the patch is effective for 72 hours, after which it should be removed.  Wrap patch in a tissue and discard in the trash. Wash hands thoroughly with soap and water. 2. You may remove the patch earlier than 72 hours if you experience unpleasant side effects which may include dry mouth, dizziness or visual disturbances. 3. Avoid touching the patch. Wash your hands with soap and water after contact with the patch.

## 2016-12-28 ENCOUNTER — Encounter (HOSPITAL_BASED_OUTPATIENT_CLINIC_OR_DEPARTMENT_OTHER): Payer: Self-pay | Admitting: Urology

## 2016-12-28 NOTE — Op Note (Signed)
NAME:  Rodney Patterson, Rodney Patterson                    ACCOUNT NO.:  MEDICAL RECORD NO.:  [id number]  LOCATION:                                 FACILITY:  PHYSICIAN:  Marshall Cork. Jeffie Pollock, M.D.         DATE OF BIRTH:  DATE OF PROCEDURE:  12/27/2016 DATE OF DISCHARGE:                              OPERATIVE REPORT   PROCEDURE: 1. Transrectal ultrasound-guided placement of gold fiducial markers. 2. Transrectal ultrasound-guided placement of SpaceOAR hydrogel.  PREOPERATIVE DIAGNOSIS:  Prostate cancer.  POSTOPERATIVE DIAGNOSIS:  Prostate cancer.  SURGEON:  Marshall Cork. Jeffie Pollock, M.D.  ASSISTANT:  Dr. Tammi Klippel.  ANESTHESIA:  General.  SPECIMEN:  None.  DRAINS:  None.  BLOOD LOSS:  None.  COMPLICATIONS:  None.  INDICATIONS:  Mr. Parthasarathy is an 83 year old white male, who is being prepared for external beam radiation and is to undergo placement of gold fiducial markers today along with SpaceOAR hydrogel.  FINDINGS AND PROCEDURE:  He was given antibiotics.  A general anesthetic was induced.  He was placed in lithotomy position and fitted with PAS hose.  His perineum was prepped with Betadine solution.  The ultrasound probe was assembled and inserted after being secured to the fixed base.  Three gold fiducial markers were placed using ultrasound guidance in the right base medial, right apex medial and left mid lateral prostate.  After placement of the gold seeds, the SpaceOAR gel was placed.  The needle was advanced in the midline through the rectourethralis hump into the space between the rectum and Denonvilliers fascia.  Small puffs of saline were used to define the space and once it was confirmed, we were in appropriate position at the mid prostate level.  The SpaceOAR gel which had been prepared was injected slowly over 12 seconds into the space with excellent distribution of the implant.  At this point, the needle was removed followed by the ultrasound probe. The wound was cleansed, and a dressing  was applied.  The patient was taken down from lithotomy position.  His anesthetic was reversed he was moved to the recovery room in stable condition.  There were no complications.     Marshall Cork. Jeffie Pollock, M.D.     JJW/MEDQ  D:  12/27/2016  T:  12/27/2016  Job:  [id number]

## 2016-12-28 NOTE — Anesthesia Postprocedure Evaluation (Signed)
Anesthesia Post Note  Patient: Rodney Patterson  Procedure(s) Performed: Procedure(s) (LRB): GOLD SEED IMPLANT WITH SPACE OAR (N/A)     Patient location during evaluation: PACU Anesthesia Type: General Level of consciousness: awake Pain management: pain level controlled Vital Signs Assessment: post-procedure vital signs reviewed and stable Respiratory status: spontaneous breathing Cardiovascular status: stable Postop Assessment: no signs of nausea or vomiting Anesthetic complications: no    Last Vitals:  Vitals:   12/27/16 1339 12/27/16 1410  BP:  (!) 141/74  Pulse: (!) 45 (!) 42  Resp: 12 14  Temp:  36.6 C  SpO2: 100% 100%    Last Pain:  Vitals:   12/27/16 1245  TempSrc:   PainSc: 0-No pain   Pain Goal: Patients Stated Pain Goal: 8 (12/27/16 0937)               [place]

## 2016-12-29 ENCOUNTER — Ambulatory Visit
Admission: RE | Admit: 2016-12-29 | Discharge: 2016-12-29 | Disposition: A | Discharge status: Home / Self Care | Payer: Medicare Other | Source: Ambulatory Visit | Attending: Radiation Oncology | Admitting: Radiation Oncology

## 2016-12-29 ENCOUNTER — Encounter: Payer: Self-pay | Admitting: Medical Oncology

## 2016-12-29 ENCOUNTER — Other Ambulatory Visit (HOSPITAL_COMMUNITY): Payer: Medicare Other

## 2016-12-29 ENCOUNTER — Ambulatory Visit (HOSPITAL_COMMUNITY)
Admission: RE | Admit: 2016-12-29 | Discharge: 2016-12-29 | Disposition: A | Discharge status: Home / Self Care | Payer: Medicare Other | Source: Ambulatory Visit | Attending: Radiation Oncology | Admitting: Radiation Oncology

## 2016-12-29 ENCOUNTER — Ambulatory Visit: Payer: Medicare Other | Admitting: Radiation Oncology

## 2016-12-29 DIAGNOSIS — C61 Malignant neoplasm of prostate: Secondary | ICD-10-CM | POA: Diagnosis not present

## 2016-12-29 DIAGNOSIS — Z510 Encounter for antineoplastic radiation therapy: Secondary | ICD-10-CM | POA: Diagnosis not present

## 2016-12-29 NOTE — Progress Notes (Signed)
  Radiation Oncology         [phone]) [phone] ________________________________  Name: Rodney Patterson MRN: [id number]  Date: 12/29/2016  DOB: 26-Oct-1932  SIMULATION AND TREATMENT PLANNING NOTE    ICD-10-CM   1. Malignant neoplasm of prostate ([place]) C61     DIAGNOSIS:  83 y.o. gentleman with Stage cT2a adenocarcinoma of the prostate with Gleason Score of 4+3, and PSA of 10.1.   NARRATIVE:  The patient was brought to the [facility].  Identity was confirmed.  All relevant records and images related to the planned course of therapy were reviewed.  The patient freely provided informed written consent to proceed with treatment after reviewing the details related to the planned course of therapy. The consent form was witnessed and verified by the simulation staff.  Then, the patient was set-up in a stable reproducible supine position for radiation therapy.  A vacuum lock pillow device was custom fabricated to position his legs in a reproducible immobilized position.  CT images were obtained.  Surface markings were placed.  The CT images were loaded into the planning software and fused with MRI for targeting and visualization of the [place].  Then the prostate target and avoidance structures including the rectum, bladder, bowel and hips were contoured.  Treatment planning then occurred.  The radiation prescription was entered and confirmed.  A total of one complex treatment devices were fabricated. I have requested : Intensity Modulated Radiotherapy (IMRT) is medically necessary for this case for the following reason:  Rectal sparing.Marland Kitchen  PLAN:  The patient will receive 45 Gy in 25 fractions of 1.8 Gy, followed by a boost to the prostate to a total dose of 75 Gy with 15 additional fractions of 2.0 Gy.  ________________________________  Sheral Apley Tammi Klippel, M.D.  This document serves as a record of services personally performed by [name], MD. It was created on his behalf by Arlyce Harman, a trained medical scribe. The creation of this record is based on the scribe's personal observations and the provider's statements to them. This document has been checked and approved by the attending provider.

## 2016-12-30 ENCOUNTER — Ambulatory Visit: Payer: Medicare Other | Admitting: Radiation Oncology

## 2017-01-04 ENCOUNTER — Ambulatory Visit: Payer: Medicare Other | Admitting: Radiation Oncology

## 2017-01-04 DIAGNOSIS — Z510 Encounter for antineoplastic radiation therapy: Secondary | ICD-10-CM | POA: Diagnosis not present

## 2017-01-05 ENCOUNTER — Ambulatory Visit
Admission: RE | Admit: 2017-01-05 | Discharge: 2017-01-05 | Disposition: A | Discharge status: Home / Self Care | Payer: Medicare Other | Source: Ambulatory Visit | Attending: Radiation Oncology | Admitting: Radiation Oncology

## 2017-01-05 ENCOUNTER — Encounter: Payer: Self-pay | Admitting: Medical Oncology

## 2017-01-05 ENCOUNTER — Ambulatory Visit: Payer: Medicare Other

## 2017-01-05 DIAGNOSIS — Z510 Encounter for antineoplastic radiation therapy: Secondary | ICD-10-CM | POA: Diagnosis not present

## 2017-01-06 ENCOUNTER — Ambulatory Visit
Admission: RE | Admit: 2017-01-06 | Discharge: 2017-01-06 | Disposition: A | Discharge status: Home / Self Care | Payer: Medicare Other | Source: Ambulatory Visit | Attending: Radiation Oncology | Admitting: Radiation Oncology

## 2017-01-06 DIAGNOSIS — Z510 Encounter for antineoplastic radiation therapy: Secondary | ICD-10-CM | POA: Diagnosis not present

## 2017-01-09 ENCOUNTER — Ambulatory Visit
Admission: RE | Admit: 2017-01-09 | Discharge: 2017-01-09 | Disposition: A | Discharge status: Home / Self Care | Payer: Medicare Other | Source: Ambulatory Visit | Attending: Radiation Oncology | Admitting: Radiation Oncology

## 2017-01-09 DIAGNOSIS — Z510 Encounter for antineoplastic radiation therapy: Secondary | ICD-10-CM | POA: Diagnosis not present

## 2017-01-10 ENCOUNTER — Ambulatory Visit
Admission: RE | Admit: 2017-01-10 | Discharge: 2017-01-10 | Disposition: A | Discharge status: Home / Self Care | Payer: Medicare Other | Source: Ambulatory Visit | Attending: Radiation Oncology | Admitting: Radiation Oncology

## 2017-01-10 DIAGNOSIS — Z510 Encounter for antineoplastic radiation therapy: Secondary | ICD-10-CM | POA: Diagnosis not present

## 2017-01-11 ENCOUNTER — Ambulatory Visit: Payer: Medicare Other | Admitting: Radiation Oncology

## 2017-01-11 ENCOUNTER — Ambulatory Visit
Admission: RE | Admit: 2017-01-11 | Discharge: 2017-01-11 | Disposition: A | Discharge status: Home / Self Care | Payer: Medicare Other | Source: Ambulatory Visit | Attending: Radiation Oncology | Admitting: Radiation Oncology

## 2017-01-11 DIAGNOSIS — Z510 Encounter for antineoplastic radiation therapy: Secondary | ICD-10-CM | POA: Diagnosis not present

## 2017-01-12 ENCOUNTER — Ambulatory Visit
Admission: RE | Admit: 2017-01-12 | Discharge: 2017-01-12 | Disposition: A | Discharge status: Home / Self Care | Payer: Medicare Other | Source: Ambulatory Visit | Attending: Radiation Oncology | Admitting: Radiation Oncology

## 2017-01-12 DIAGNOSIS — Z510 Encounter for antineoplastic radiation therapy: Secondary | ICD-10-CM | POA: Diagnosis not present

## 2017-01-13 ENCOUNTER — Ambulatory Visit
Admission: RE | Admit: 2017-01-13 | Discharge: 2017-01-13 | Disposition: A | Discharge status: Home / Self Care | Payer: Medicare Other | Source: Ambulatory Visit | Attending: Radiation Oncology | Admitting: Radiation Oncology

## 2017-01-13 DIAGNOSIS — Z510 Encounter for antineoplastic radiation therapy: Secondary | ICD-10-CM | POA: Diagnosis not present

## 2017-01-16 ENCOUNTER — Ambulatory Visit
Admission: RE | Admit: 2017-01-16 | Discharge: 2017-01-16 | Disposition: A | Discharge status: Home / Self Care | Payer: Medicare Other | Source: Ambulatory Visit | Attending: Radiation Oncology | Admitting: Radiation Oncology

## 2017-01-16 DIAGNOSIS — Z510 Encounter for antineoplastic radiation therapy: Secondary | ICD-10-CM | POA: Diagnosis not present

## 2017-01-17 ENCOUNTER — Ambulatory Visit
Admission: RE | Admit: 2017-01-17 | Discharge: 2017-01-17 | Disposition: A | Discharge status: Home / Self Care | Payer: Medicare Other | Source: Ambulatory Visit | Attending: Radiation Oncology | Admitting: Radiation Oncology

## 2017-01-17 DIAGNOSIS — Z510 Encounter for antineoplastic radiation therapy: Secondary | ICD-10-CM | POA: Diagnosis not present

## 2017-01-18 ENCOUNTER — Ambulatory Visit
Admission: RE | Admit: 2017-01-18 | Discharge: 2017-01-18 | Disposition: A | Discharge status: Home / Self Care | Payer: Medicare Other | Source: Ambulatory Visit | Attending: Radiation Oncology | Admitting: Radiation Oncology

## 2017-01-18 DIAGNOSIS — Z510 Encounter for antineoplastic radiation therapy: Secondary | ICD-10-CM | POA: Diagnosis not present

## 2017-01-19 ENCOUNTER — Ambulatory Visit
Admission: RE | Admit: 2017-01-19 | Discharge: 2017-01-19 | Disposition: A | Discharge status: Home / Self Care | Payer: Medicare Other | Source: Ambulatory Visit | Attending: Radiation Oncology | Admitting: Radiation Oncology

## 2017-01-19 DIAGNOSIS — Z510 Encounter for antineoplastic radiation therapy: Secondary | ICD-10-CM | POA: Diagnosis not present

## 2017-01-20 ENCOUNTER — Ambulatory Visit
Admission: RE | Admit: 2017-01-20 | Discharge: 2017-01-20 | Disposition: A | Discharge status: Home / Self Care | Payer: Medicare Other | Source: Ambulatory Visit | Attending: Radiation Oncology | Admitting: Radiation Oncology

## 2017-01-20 DIAGNOSIS — Z510 Encounter for antineoplastic radiation therapy: Secondary | ICD-10-CM | POA: Diagnosis not present

## 2017-01-23 ENCOUNTER — Ambulatory Visit
Admission: RE | Admit: 2017-01-23 | Discharge: 2017-01-23 | Disposition: A | Discharge status: Home / Self Care | Payer: Medicare Other | Source: Ambulatory Visit | Attending: Radiation Oncology | Admitting: Radiation Oncology

## 2017-01-23 DIAGNOSIS — Z510 Encounter for antineoplastic radiation therapy: Secondary | ICD-10-CM | POA: Diagnosis not present

## 2017-01-24 ENCOUNTER — Ambulatory Visit
Admission: RE | Admit: 2017-01-24 | Discharge: 2017-01-24 | Disposition: A | Discharge status: Home / Self Care | Payer: Medicare Other | Source: Ambulatory Visit | Attending: Radiation Oncology | Admitting: Radiation Oncology

## 2017-01-24 DIAGNOSIS — Z510 Encounter for antineoplastic radiation therapy: Secondary | ICD-10-CM | POA: Diagnosis not present

## 2017-01-25 ENCOUNTER — Ambulatory Visit
Admission: RE | Admit: 2017-01-25 | Discharge: 2017-01-25 | Disposition: A | Discharge status: Home / Self Care | Payer: Medicare Other | Source: Ambulatory Visit | Attending: Radiation Oncology | Admitting: Radiation Oncology

## 2017-01-25 DIAGNOSIS — Z510 Encounter for antineoplastic radiation therapy: Secondary | ICD-10-CM | POA: Diagnosis not present

## 2017-01-26 ENCOUNTER — Ambulatory Visit
Admission: RE | Admit: 2017-01-26 | Discharge: 2017-01-26 | Disposition: A | Discharge status: Home / Self Care | Payer: Medicare Other | Source: Ambulatory Visit | Attending: Radiation Oncology | Admitting: Radiation Oncology

## 2017-01-26 DIAGNOSIS — Z510 Encounter for antineoplastic radiation therapy: Secondary | ICD-10-CM | POA: Diagnosis not present

## 2017-01-27 ENCOUNTER — Ambulatory Visit
Admission: RE | Admit: 2017-01-27 | Discharge: 2017-01-27 | Disposition: A | Discharge status: Home / Self Care | Payer: Medicare Other | Source: Ambulatory Visit | Attending: Radiation Oncology | Admitting: Radiation Oncology

## 2017-01-27 DIAGNOSIS — Z510 Encounter for antineoplastic radiation therapy: Secondary | ICD-10-CM | POA: Diagnosis not present

## 2017-01-30 ENCOUNTER — Ambulatory Visit
Admission: RE | Admit: 2017-01-30 | Discharge: 2017-01-30 | Disposition: A | Discharge status: Home / Self Care | Payer: Medicare Other | Source: Ambulatory Visit | Attending: Radiation Oncology | Admitting: Radiation Oncology

## 2017-01-30 DIAGNOSIS — Z510 Encounter for antineoplastic radiation therapy: Secondary | ICD-10-CM | POA: Diagnosis not present

## 2017-01-31 ENCOUNTER — Ambulatory Visit
Admission: RE | Admit: 2017-01-31 | Discharge: 2017-01-31 | Disposition: A | Discharge status: Home / Self Care | Payer: Medicare Other | Source: Ambulatory Visit | Attending: Radiation Oncology | Admitting: Radiation Oncology

## 2017-01-31 ENCOUNTER — Telehealth: Payer: Self-pay | Admitting: Urology

## 2017-01-31 DIAGNOSIS — Z510 Encounter for antineoplastic radiation therapy: Secondary | ICD-10-CM | POA: Diagnosis not present

## 2017-01-31 MED ORDER — DIPHENOXYLATE-ATROPINE 2.5-0.025 MG PO TABS: 2.0000 | ORAL_TABLET | Freq: Two times a day (BID) | ORAL | 0 refills | Status: DC | PRN

## 2017-01-31 NOTE — Telephone Encounter (Signed)
I returned a phone call from Ms. Lynda Rainwater regarding Mr. Old.  She reports that he continues to struggle with diarrhea which is limiting his ability to remain active and leave the house as he would like.  He is also starting to severely limit his oral intake due to fear of increased diarrhea and flatus.  She feels like he is beginning to get depressed and has even been talking about quitting treatment.  He has tried Imodium without significant relief.  He has not had hematochezia, fever, nausea or vomiting to her knowledge. He is tolerating the LUTS relatively well but is having a hard time dealing with the diarrhea.  He has used Lomotil in the past with success and is interested in trying this.  I have called a Rx for L[facility]il [facility] [place] and she will pick this up later this afternoon.  I also advised to follow a BRAT diet and increase fluids- mainly water and/or gatorade/powerade which she will encourage.  We will plan to see him as scheduled later this week but I am happy to see him sooner prn.  She is in agreement with the plan as stated above and quite appreciative for the call. Nicholos Johns, PA-C

## 2017-02-01 ENCOUNTER — Ambulatory Visit
Admission: RE | Admit: 2017-02-01 | Discharge: 2017-02-01 | Disposition: A | Discharge status: Home / Self Care | Payer: Medicare Other | Source: Ambulatory Visit | Attending: Radiation Oncology | Admitting: Radiation Oncology

## 2017-02-01 DIAGNOSIS — Z510 Encounter for antineoplastic radiation therapy: Secondary | ICD-10-CM | POA: Diagnosis not present

## 2017-02-02 ENCOUNTER — Ambulatory Visit
Admission: RE | Admit: 2017-02-02 | Discharge: 2017-02-02 | Disposition: A | Discharge status: Home / Self Care | Payer: Medicare Other | Source: Ambulatory Visit | Attending: Radiation Oncology | Admitting: Radiation Oncology

## 2017-02-02 DIAGNOSIS — Z510 Encounter for antineoplastic radiation therapy: Secondary | ICD-10-CM | POA: Diagnosis not present

## 2017-02-03 ENCOUNTER — Ambulatory Visit
Admission: RE | Admit: 2017-02-03 | Discharge: 2017-02-03 | Disposition: A | Discharge status: Home / Self Care | Payer: Medicare Other | Source: Ambulatory Visit | Attending: Radiation Oncology | Admitting: Radiation Oncology

## 2017-02-03 DIAGNOSIS — Z510 Encounter for antineoplastic radiation therapy: Secondary | ICD-10-CM | POA: Diagnosis not present

## 2017-02-06 ENCOUNTER — Ambulatory Visit
Admission: RE | Admit: 2017-02-06 | Discharge: 2017-02-06 | Disposition: A | Discharge status: Home / Self Care | Payer: Medicare Other | Source: Ambulatory Visit | Attending: Radiation Oncology | Admitting: Radiation Oncology

## 2017-02-06 DIAGNOSIS — Z510 Encounter for antineoplastic radiation therapy: Secondary | ICD-10-CM | POA: Diagnosis not present

## 2017-02-07 ENCOUNTER — Ambulatory Visit
Admission: RE | Admit: 2017-02-07 | Discharge: 2017-02-07 | Disposition: A | Discharge status: Home / Self Care | Payer: Medicare Other | Source: Ambulatory Visit | Attending: Radiation Oncology | Admitting: Radiation Oncology

## 2017-02-07 DIAGNOSIS — Z510 Encounter for antineoplastic radiation therapy: Secondary | ICD-10-CM | POA: Diagnosis not present

## 2017-02-08 ENCOUNTER — Ambulatory Visit
Admission: RE | Admit: 2017-02-08 | Discharge: 2017-02-08 | Disposition: A | Discharge status: Home / Self Care | Payer: Medicare Other | Source: Ambulatory Visit | Attending: Radiation Oncology | Admitting: Radiation Oncology

## 2017-02-08 DIAGNOSIS — Z510 Encounter for antineoplastic radiation therapy: Secondary | ICD-10-CM | POA: Diagnosis not present

## 2017-02-09 ENCOUNTER — Ambulatory Visit
Admission: RE | Admit: 2017-02-09 | Discharge: 2017-02-09 | Disposition: A | Discharge status: Home / Self Care | Payer: Medicare Other | Source: Ambulatory Visit | Attending: Radiation Oncology | Admitting: Radiation Oncology

## 2017-02-09 DIAGNOSIS — Z510 Encounter for antineoplastic radiation therapy: Secondary | ICD-10-CM | POA: Diagnosis not present

## 2017-02-10 ENCOUNTER — Ambulatory Visit
Admission: RE | Admit: 2017-02-10 | Discharge: 2017-02-10 | Disposition: A | Discharge status: Home / Self Care | Payer: Medicare Other | Source: Ambulatory Visit | Attending: Radiation Oncology | Admitting: Radiation Oncology

## 2017-02-10 DIAGNOSIS — Z510 Encounter for antineoplastic radiation therapy: Secondary | ICD-10-CM | POA: Diagnosis not present

## 2017-02-13 ENCOUNTER — Ambulatory Visit
Admission: RE | Admit: 2017-02-13 | Discharge: 2017-02-13 | Disposition: A | Discharge status: Home / Self Care | Payer: Medicare Other | Source: Ambulatory Visit | Attending: Radiation Oncology | Admitting: Radiation Oncology

## 2017-02-13 DIAGNOSIS — Z510 Encounter for antineoplastic radiation therapy: Secondary | ICD-10-CM | POA: Diagnosis not present

## 2017-02-14 ENCOUNTER — Other Ambulatory Visit: Payer: Self-pay | Admitting: Urology

## 2017-02-14 ENCOUNTER — Ambulatory Visit: Payer: Medicare Other

## 2017-02-14 DIAGNOSIS — R197 Diarrhea, unspecified: Secondary | ICD-10-CM

## 2017-02-14 MED ORDER — DIPHENOXYLATE-ATROPINE 2.5-0.025 MG PO TABS: 2.0000 | ORAL_TABLET | Freq: Three times a day (TID) | ORAL | 0 refills | Status: DC | PRN

## 2017-02-14 NOTE — Progress Notes (Signed)
I spoke with Ms. Rodney Patterson.  She reports that Rodney Patterson had a great day on Sunday and was able to get out to watch his granddaughter play basketball in [place].  He seemed to be improving regarding his diarrhea and bloating since starting his Boost treatments and continuing to use Lomotil and Imodium prn.  However, on Monday, he ate a meal high in fat and had a lot of collard greens. He has been miserable with diarrhea since that time and unable to keep anything on his stomach.  He vomited once this morning but has not since that time.  He has little appetite today and is feeling weak and "washed out".  He was unable to come for his scheduled treatment today.  I advised that they could increase his Lomotil to 2 tablets three time daily prn diarrhea/loose stools and encouraged them to follow a strict low residue and low fat diet.  It sounds like his diet was the biggest culprit in his increased bowel symptoms.  Advised to push fluids today, encouraged gatorade/Propel and BRAT diet until his bowels are more settled.  They can then gradually ease back into a more normal low residue/low fat diet as tolerated.  She stated her understanding and agreement with the stated plan.  I will have the nursing staff call in a refill of the Lomotil.  He is planning to come for scheduled treatment tomorrow.   Nicholos Johns, PA-C

## 2017-02-15 ENCOUNTER — Telehealth: Payer: Self-pay | Admitting: Radiation Oncology

## 2017-02-15 ENCOUNTER — Ambulatory Visit
Admission: RE | Admit: 2017-02-15 | Discharge: 2017-02-15 | Disposition: A | Discharge status: Home / Self Care | Payer: Medicare Other | Source: Ambulatory Visit | Attending: Radiation Oncology | Admitting: Radiation Oncology

## 2017-02-15 DIAGNOSIS — Z510 Encounter for antineoplastic radiation therapy: Secondary | ICD-10-CM | POA: Diagnosis not present

## 2017-02-15 NOTE — Telephone Encounter (Signed)
Per Freeman Caldron, PA-C order this RN called in [facility] [place].

## 2017-02-16 ENCOUNTER — Ambulatory Visit
Admission: RE | Admit: 2017-02-16 | Discharge: 2017-02-16 | Disposition: A | Discharge status: Home / Self Care | Payer: Medicare Other | Source: Ambulatory Visit | Attending: Radiation Oncology | Admitting: Radiation Oncology

## 2017-02-16 DIAGNOSIS — Z510 Encounter for antineoplastic radiation therapy: Secondary | ICD-10-CM | POA: Diagnosis not present

## 2017-02-17 ENCOUNTER — Ambulatory Visit
Admission: RE | Admit: 2017-02-17 | Discharge: 2017-02-17 | Disposition: A | Discharge status: Home / Self Care | Payer: Medicare Other | Source: Ambulatory Visit | Attending: Radiation Oncology | Admitting: Radiation Oncology

## 2017-02-17 DIAGNOSIS — Z510 Encounter for antineoplastic radiation therapy: Secondary | ICD-10-CM | POA: Diagnosis not present

## 2017-02-20 ENCOUNTER — Telehealth: Payer: Self-pay | Admitting: Radiation Oncology

## 2017-02-20 ENCOUNTER — Ambulatory Visit
Admission: RE | Admit: 2017-02-20 | Discharge: 2017-02-20 | Disposition: A | Discharge status: Home / Self Care | Payer: Medicare Other | Source: Ambulatory Visit | Attending: Radiation Oncology | Admitting: Radiation Oncology

## 2017-02-20 DIAGNOSIS — Z510 Encounter for antineoplastic radiation therapy: Secondary | ICD-10-CM | POA: Diagnosis not present

## 2017-02-20 DIAGNOSIS — C61 Malignant neoplasm of prostate: Secondary | ICD-10-CM

## 2017-02-20 DIAGNOSIS — R112 Nausea with vomiting, unspecified: Secondary | ICD-10-CM

## 2017-02-20 MED ORDER — ONDANSETRON HCL 8 MG PO TABS: 8.0000 mg | ORAL_TABLET | Freq: Three times a day (TID) | ORAL | 1 refills | Status: DC | PRN

## 2017-02-20 NOTE — Telephone Encounter (Signed)
Per Shona Simpson, PA-C order I called in Zofran 8 mg tid po/SL to [facility] [place]. Then, I phoned the patient's wife, Holley Raring, and explained this was done. Answered all of her questions to the best of my ability. She verbalized understanding of all discussed.

## 2017-02-21 ENCOUNTER — Ambulatory Visit
Admission: RE | Admit: 2017-02-21 | Discharge: 2017-02-21 | Disposition: A | Discharge status: Home / Self Care | Payer: Medicare Other | Source: Ambulatory Visit | Attending: Urology | Admitting: Urology

## 2017-02-21 ENCOUNTER — Other Ambulatory Visit: Payer: Self-pay | Admitting: Urology

## 2017-02-21 ENCOUNTER — Ambulatory Visit
Admission: RE | Admit: 2017-02-21 | Discharge: 2017-02-21 | Disposition: A | Discharge status: Home / Self Care | Payer: Medicare Other | Source: Ambulatory Visit | Attending: Radiation Oncology | Admitting: Radiation Oncology

## 2017-02-21 DIAGNOSIS — Z510 Encounter for antineoplastic radiation therapy: Secondary | ICD-10-CM | POA: Diagnosis not present

## 2017-02-21 DIAGNOSIS — C61 Malignant neoplasm of prostate: Secondary | ICD-10-CM

## 2017-02-21 DIAGNOSIS — R197 Diarrhea, unspecified: Secondary | ICD-10-CM

## 2017-02-21 MED ORDER — CHOLESTYRAMINE 4 G PO PACK: 4.0000 g | PACK | Freq: Two times a day (BID) | ORAL | 0 refills | Status: DC

## 2017-02-21 NOTE — Progress Notes (Signed)
Patient was seen today following his treatment due to persistent diffuse diarrhea despite using Lomotil TID and Imodium for breakthrough episodes.  He reports that everything he eats or drinks runs straight through him.  He admits that he has significantly limited what he is eating or drinking due to fear of diarrhea but denies dizziness when standing.  He is feeling weak and fatigued in general. He has had a couple of episodes of nausea with vomiting but this was managed with Zofran and he has not had any further N/V.  He denies hematuria or hematochezia. He denies significant abdominal pain, bloating or cramping. He has not had fever, chills or night sweats.  He has been using the Lomtil TID as prescribed but rarely using imodium for breakthrough diarrhea.  I advised to use Lomotil QID prn diarrhea and Imodium prn (not to exceed 8 tablets daily).  Due to his history of bowel issues previously with diverticulitis, if his diarrhea persists and is unrelieved with current recommendations, I would recommend he consider a referral to GI.  His wife has requested a trial of cholestyramine, as this has worked well in the past when he had diarrhea post cholecystectomy. A Rx was provided but I advised to hold off on this until he had tried scheduled Lomotil QID with Imodium prn breakthrough as discussed.  Also advised to continue to follow a low residue, low fat diet. Increase fluids, particularly intake of gatorade/powerade to replace electrolytes.  I advised checking his electrolytes via CMP today but they prefer to hold off on labs for now.  Vitals are currently stable and without evidence of orthostatic hypotension.  He certainly has decreased skin turgor, indicating at least moderate dehydration so I have emphasized the need to increase fluids and electrolytes.  They state their understanding and compliance and will report back to me later this week regarding progress with recommended regimen.   Nicholos Johns,  PA-C

## 2017-02-22 ENCOUNTER — Ambulatory Visit
Admission: RE | Admit: 2017-02-22 | Discharge: 2017-02-22 | Disposition: A | Discharge status: Home / Self Care | Payer: Medicare Other | Source: Ambulatory Visit | Attending: Radiation Oncology | Admitting: Radiation Oncology

## 2017-02-22 DIAGNOSIS — Z510 Encounter for antineoplastic radiation therapy: Secondary | ICD-10-CM | POA: Diagnosis not present

## 2017-02-23 ENCOUNTER — Ambulatory Visit
Admission: RE | Admit: 2017-02-23 | Discharge: 2017-02-23 | Disposition: A | Discharge status: Home / Self Care | Payer: Medicare Other | Source: Ambulatory Visit | Attending: Radiation Oncology | Admitting: Radiation Oncology

## 2017-02-23 DIAGNOSIS — Z510 Encounter for antineoplastic radiation therapy: Secondary | ICD-10-CM | POA: Diagnosis not present

## 2017-02-24 ENCOUNTER — Ambulatory Visit
Admission: RE | Admit: 2017-02-24 | Discharge: 2017-02-24 | Disposition: A | Discharge status: Home / Self Care | Payer: Medicare Other | Source: Ambulatory Visit | Attending: Radiation Oncology | Admitting: Radiation Oncology

## 2017-02-24 DIAGNOSIS — Z510 Encounter for antineoplastic radiation therapy: Secondary | ICD-10-CM | POA: Diagnosis not present

## 2017-02-27 ENCOUNTER — Ambulatory Visit
Admission: RE | Admit: 2017-02-27 | Discharge: 2017-02-27 | Disposition: A | Discharge status: Home / Self Care | Payer: Medicare Other | Source: Ambulatory Visit | Attending: Radiation Oncology | Admitting: Radiation Oncology

## 2017-02-27 DIAGNOSIS — Z510 Encounter for antineoplastic radiation therapy: Secondary | ICD-10-CM | POA: Diagnosis not present

## 2017-02-28 ENCOUNTER — Ambulatory Visit
Admission: RE | Admit: 2017-02-28 | Discharge: 2017-02-28 | Disposition: A | Discharge status: Home / Self Care | Payer: Medicare Other | Source: Ambulatory Visit | Attending: Radiation Oncology | Admitting: Radiation Oncology

## 2017-02-28 ENCOUNTER — Other Ambulatory Visit: Payer: Self-pay | Admitting: Urology

## 2017-02-28 DIAGNOSIS — Z510 Encounter for antineoplastic radiation therapy: Secondary | ICD-10-CM | POA: Diagnosis not present

## 2017-02-28 DIAGNOSIS — R197 Diarrhea, unspecified: Secondary | ICD-10-CM

## 2017-02-28 MED ORDER — DIPHENOXYLATE-ATROPINE 2.5-0.025 MG PO TABS: 2.0000 | ORAL_TABLET | Freq: Three times a day (TID) | ORAL | 0 refills | Status: DC | PRN

## 2017-02-28 NOTE — Progress Notes (Signed)
[facility] [place] as per patient request.

## 2017-03-01 ENCOUNTER — Ambulatory Visit: Payer: Medicare Other

## 2017-03-01 ENCOUNTER — Ambulatory Visit
Admission: RE | Admit: 2017-03-01 | Discharge: 2017-03-01 | Disposition: A | Discharge status: Home / Self Care | Payer: Medicare Other | Source: Ambulatory Visit | Attending: Radiation Oncology | Admitting: Radiation Oncology

## 2017-03-01 DIAGNOSIS — Z510 Encounter for antineoplastic radiation therapy: Secondary | ICD-10-CM | POA: Diagnosis not present

## 2017-03-02 ENCOUNTER — Ambulatory Visit
Admission: RE | Admit: 2017-03-02 | Discharge: 2017-03-02 | Disposition: A | Discharge status: Home / Self Care | Payer: Medicare Other | Source: Ambulatory Visit | Attending: Urology | Admitting: Urology

## 2017-03-02 ENCOUNTER — Other Ambulatory Visit: Payer: Self-pay | Admitting: Urology

## 2017-03-02 ENCOUNTER — Ambulatory Visit
Admission: RE | Admit: 2017-03-02 | Discharge: 2017-03-02 | Disposition: A | Discharge status: Home / Self Care | Payer: Medicare Other | Source: Ambulatory Visit | Attending: Radiation Oncology | Admitting: Radiation Oncology

## 2017-03-02 ENCOUNTER — Ambulatory Visit: Payer: Medicare Other

## 2017-03-02 ENCOUNTER — Encounter: Payer: Self-pay | Admitting: Urology

## 2017-03-02 DIAGNOSIS — R197 Diarrhea, unspecified: Secondary | ICD-10-CM

## 2017-03-02 DIAGNOSIS — C61 Malignant neoplasm of prostate: Secondary | ICD-10-CM

## 2017-03-02 DIAGNOSIS — E860 Dehydration: Secondary | ICD-10-CM

## 2017-03-02 DIAGNOSIS — Z510 Encounter for antineoplastic radiation therapy: Secondary | ICD-10-CM | POA: Diagnosis not present

## 2017-03-02 LAB — CBC WITH DIFFERENTIAL/PLATELET
BASO%: 0.8 % (ref 0.0–2.0)
Basophils Absolute: 0 10*3/uL (ref 0.0–0.1)
EOS%: 8.5 % — AB (ref 0.0–7.0)
Eosinophils Absolute: 0.4 10*3/uL (ref 0.0–0.5)
HEMATOCRIT: 38.9 % (ref 38.4–49.9)
HEMOGLOBIN: 13.7 g/dL (ref 13.0–17.1)
LYMPH#: 0.5 10*3/uL — AB (ref 0.9–3.3)
LYMPH%: 11.9 % — ABNORMAL LOW (ref 14.0–49.0)
MCH: 33.6 pg — AB (ref 27.2–33.4)
MCHC: 35.2 g/dL (ref 32.0–36.0)
MCV: 95.7 fL (ref 79.3–98.0)
MONO#: 0.6 10*3/uL (ref 0.1–0.9)
MONO%: 12.6 % (ref 0.0–14.0)
NEUT#: 3 10*3/uL (ref 1.5–6.5)
NEUT%: 66.2 % (ref 39.0–75.0)
Platelets: 257 10*3/uL (ref 140–400)
RBC: 4.07 10*6/uL — ABNORMAL LOW (ref 4.20–5.82)
RDW: 13.5 % (ref 11.0–14.6)
WBC: 4.5 10*3/uL (ref 4.0–10.3)

## 2017-03-02 LAB — COMPREHENSIVE METABOLIC PANEL
ALBUMIN: 3.6 g/dL (ref 3.5–5.0)
ALK PHOS: 110 U/L (ref 40–150)
ALT: 23 U/L (ref 0–55)
AST: 21 U/L (ref 5–34)
Anion Gap: 7 mEq/L (ref 3–11)
BUN: 23 mg/dL (ref 7.0–26.0)
CALCIUM: 9.2 mg/dL (ref 8.4–10.4)
CHLORIDE: 104 meq/L (ref 98–109)
CO2: 24 mEq/L (ref 22–29)
CREATININE: 1.6 mg/dL — AB (ref 0.7–1.3)
EGFR: 38 mL/min/{1.73_m2} — ABNORMAL LOW (ref >60)
GLUCOSE: 98 mg/dL (ref 70–140)
POTASSIUM: 4.3 meq/L (ref 3.5–5.1)
SODIUM: 135 meq/L — AB (ref 136–145)
Total Bilirubin: 0.71 mg/dL (ref 0.20–1.20)
Total Protein: 7.4 g/dL (ref 6.4–8.3)

## 2017-03-03 ENCOUNTER — Ambulatory Visit: Payer: Medicare Other

## 2017-03-03 ENCOUNTER — Other Ambulatory Visit: Payer: Self-pay | Admitting: Radiation Oncology

## 2017-03-03 ENCOUNTER — Ambulatory Visit
Admission: RE | Admit: 2017-03-03 | Discharge: 2017-03-03 | Disposition: A | Discharge status: Home / Self Care | Payer: Medicare Other | Source: Ambulatory Visit | Attending: Radiation Oncology | Admitting: Radiation Oncology

## 2017-03-03 ENCOUNTER — Telehealth: Payer: Self-pay | Admitting: *Deleted

## 2017-03-03 DIAGNOSIS — C61 Malignant neoplasm of prostate: Secondary | ICD-10-CM

## 2017-03-03 DIAGNOSIS — Z510 Encounter for antineoplastic radiation therapy: Secondary | ICD-10-CM | POA: Diagnosis not present

## 2017-03-03 DIAGNOSIS — R197 Diarrhea, unspecified: Secondary | ICD-10-CM

## 2017-03-03 MED ORDER — SODIUM CHLORIDE 0.9 % IV SOLN
INTRAVENOUS | Status: DC
  Administered 2017-03-03: 09:00:00 via INTRAVENOUS
  Filled 2017-03-03 (×2): qty 1000

## 2017-03-03 NOTE — Telephone Encounter (Signed)
PATIENT WILL HAVE AN APPT. WITH JENNIFER LEMONS OF [facility] ON 03-14-17- ARRIVAL TIME - 10:30 AM - ADDRESS- [address] THIRD FLOOR, PT. GIVEN APPT. CARD

## 2017-03-03 NOTE — Progress Notes (Signed)
0800 Patient returned to the clinic this morning accompanied by his wife for further hydration. Right hand 24 gauge IV present. Site flushed without restriction or pain. Patient reports he feels slightly better this morning following hydration last night. Started infusion of 1000 cc of normal saline as ordered by Dr. Tammi Klippel. Call bell within reach. Will continue to monitor patient.

## 2017-03-03 NOTE — Progress Notes (Addendum)
Late entry from 1432. Received patient in the clinic following his final radiation treatment. Patient accompanied by wife. Patient complains of weakness, continued diarrhea, and weight loss. Orthostatic vitals obtained. Patient does note prove to be dehydrated per vitals. However, poor turgor is noted. Patient denies feeling dizzy upon standing. Patient sent to lab. Following lab draw the patient returned to the radiation clinic. Lab works proved to be WNL. Per Dr. Johny Shears order after several attempts a 24 gauge IV catheter was place and 1000 cc of normal infused. Patient tolerated this well. Patient was discharged home with wife at 67 with IV still in. IV site was wrapped securely. Patient understands to return tomorrow (11/2) morning for another infusion of 1000 cc of normal saline per Dr. Johny Shears order. Patient was ambulatory and in no distress upon discharge.

## 2017-03-03 NOTE — Addendum Note (Signed)
Encounter addended by: Heywood Footman, RN on: 03/03/2017  4:12 PM<BR>    Actions taken: Sign clinical note

## 2017-03-03 NOTE — Progress Notes (Addendum)
Transfusion complete. Right hand 24 gauge IV removed. Catheter intact upon removal. Applied occlusive dressing to old IV site. Patient tolerated this well. Patient discharged home, ambulatory into the care of his wife. Patient understands to call [phone] with future needs. Also, patient and his wife understand that a 3-4 month follow up with PSA at Dr. Ralene Muskrat office is need. In addition, patient understands that [facility] does not open their 2019 appointment calendar until the end of December. Explained to the patient he will receive a call from [facility] with his appointment date and time after December 31st. Patient verbalized understanding.

## 2017-03-06 ENCOUNTER — Ambulatory Visit: Payer: Medicare Other

## 2017-03-07 ENCOUNTER — Ambulatory Visit: Payer: Medicare Other

## 2017-03-07 ENCOUNTER — Telehealth: Payer: Self-pay | Admitting: Radiation Oncology

## 2017-03-07 NOTE — Telephone Encounter (Signed)
Spoke with patient's wife, Rodney Patterson, after 4 pm on Monday, November 5th. She reports her husband had diarrhea and abdominal cramping all weekend (worse on Sunday) despite taking Imodium, Lomotil and cholestyramine. GI consult isn't for another week. She reports calling [facility] earlier to inquire about moving his appointment up but was told there is nothing available. She explains that Monday has "been a better day for him with less diarrhea." Instructed her that should her husbands diarrhea return as fiercly as she described she should take him to the emergency for further evaluation and possible hydration. She verbalized understanding and expressed appreciation for the call.   Phoned patient's wife this morning.She states, "he had a pretty good night last night but, has already called (she is out running errands) today saying his stomach is cramping." Explained that Ashlyn Bruning, PA-C agreed with my recommendation of presenting to the emergency room should diarrhea return. Also, informed her Ashlyn has not heard back from Dr. Eugenia Pancoast with recommendations for the interim until his GI consult in a week. Again, she verbalized understanding and expressed appreciation for the call.

## 2017-03-08 ENCOUNTER — Other Ambulatory Visit: Payer: Self-pay | Admitting: Urology

## 2017-03-08 ENCOUNTER — Telehealth: Payer: Self-pay | Admitting: Urology

## 2017-03-08 DIAGNOSIS — R197 Diarrhea, unspecified: Secondary | ICD-10-CM

## 2017-03-08 NOTE — Telephone Encounter (Signed)
I called and spoke with Rodney Patterson this morning. He reports that he has continued with intermittent bouts of diarrhea which was worse over the weekend but as of last night and this morning it has eased off of it. I advised him to come by the lab to pick up materials to collect a stool sample to check a GI pathogen panel as recommended by Dr. Ardis Hughs. I also reminded him to continue taking the Lomotil and Imodium on a scheduled basis as previously recommended. He reports that he has been only taking 2 Lomotil and 1 Imodium 3 times a day but will increase this to 2 Lomotil and 2 Imodium 3 times a day unless he becomes constipated, and at that point, we will back off the Imodium.  He is planning to have his wife come by today to pick up the materials for the stool studies and will return a specimen either later today or in the morning. I advised that we would call with results once they are available. He is in agreement with this plan and was grateful for the information. He knows to call us with any questions or concerns.   Nicholos Johns, PA-C

## 2017-03-09 ENCOUNTER — Ambulatory Visit: Payer: Medicare Other

## 2017-03-09 ENCOUNTER — Other Ambulatory Visit (HOSPITAL_COMMUNITY)
Admission: RE | Admit: 2017-03-09 | Discharge: 2017-03-09 | Disposition: A | Discharge status: Home / Self Care | Payer: Medicare Other | Source: Ambulatory Visit | Attending: Radiation Oncology | Admitting: Radiation Oncology

## 2017-03-09 DIAGNOSIS — R197 Diarrhea, unspecified: Secondary | ICD-10-CM | POA: Diagnosis present

## 2017-03-09 LAB — GASTROINTESTINAL PANEL BY PCR, STOOL (REPLACES STOOL CULTURE)
ADENOVIRUS F40/41: NOT DETECTED
ASTROVIRUS: NOT DETECTED
Campylobacter species: NOT DETECTED
Cryptosporidium: NOT DETECTED
Cyclospora cayetanensis: NOT DETECTED
ENTAMOEBA HISTOLYTICA: NOT DETECTED
ENTEROAGGREGATIVE E COLI (EAEC): NOT DETECTED
ENTEROPATHOGENIC E COLI (EPEC): NOT DETECTED
ENTEROTOXIGENIC E COLI (ETEC): NOT DETECTED
GIARDIA LAMBLIA: NOT DETECTED
NOROVIRUS GI/GII: NOT DETECTED
Plesimonas shigelloides: NOT DETECTED
Rotavirus A: NOT DETECTED
SAPOVIRUS (I, II, IV, AND V): NOT DETECTED
SHIGA LIKE TOXIN PRODUCING E COLI (STEC): NOT DETECTED
Salmonella species: NOT DETECTED
Shigella/Enteroinvasive E coli (EIEC): NOT DETECTED
VIBRIO CHOLERAE: NOT DETECTED
Vibrio species: NOT DETECTED
Yersinia enterocolitica: NOT DETECTED

## 2017-03-14 ENCOUNTER — Ambulatory Visit (INDEPENDENT_AMBULATORY_CARE_PROVIDER_SITE_OTHER): Payer: Medicare Other | Admitting: Physician Assistant

## 2017-03-14 ENCOUNTER — Encounter: Payer: Self-pay | Admitting: Physician Assistant

## 2017-03-14 ENCOUNTER — Telehealth: Payer: Self-pay | Admitting: Radiation Oncology

## 2017-03-14 VITALS — BP 100/60 | HR 76 | Ht 71.0 in | Wt 156.2 lb

## 2017-03-14 DIAGNOSIS — R197 Diarrhea, unspecified: Secondary | ICD-10-CM

## 2017-03-14 MED ORDER — DIPHENOXYLATE-ATROPINE 2.5-0.025 MG PO TABS: 2.0000 | ORAL_TABLET | Freq: Three times a day (TID) | ORAL | 2 refills | Status: DC | PRN

## 2017-03-14 NOTE — Telephone Encounter (Signed)
Phoned patient as requested by Freeman Caldron, PA-C. Explained that per Ashlyn his stool specimen DOES NOT show any infection. Encouraged patient to keep appointment with GI doctor. Patient confirms understanding. Patient explains he is headed to see the GI doctor now. Patient expresses appreciation for the call.

## 2017-03-14 NOTE — Patient Instructions (Signed)
We have sent the following medications to your pharmacy for you to pick up at your convenience: Lomotil

## 2017-03-14 NOTE — Progress Notes (Addendum)
  Radiation Oncology         [phone]) [phone] ________________________________  Name: Rodney Patterson MRN: [id number]  Date: 03/02/2017  DOB: May 20, 1932  End of Treatment Note  Diagnosis:   83 y.o. gentleman with Stage cT2a adenocarcinoma of the prostate with Gleason Score of 4+3 and PSA of 10.1.   Indication for treatment:  Curative, Definitive Radiotherapy       Radiation treatment dates:   01/05/17 - 03/02/17  Site/dose:   The prostate was treated to 75 Gy in 40 fractions. Prostate and pelvic lymph nodes received 45 Gy in 25 fractions using photons with an IMRT technique. The Prostate was boosted with 30 Gy in 15 fractions using photons with an IMRT technique for a total of 75 Gy  Beams/energy:   The patient was treated with IMRT using volumetric arc therapy delivering 6 MV X-rays to clockwise and counterclockwise circumferential arcs with a 90 degree collimator offset to avoid dose scalloping.  Image guidance was performed with daily cone beam CT prior to each fraction to align to gold markers in the prostate and assure proper bladder and rectal fill volumes.  Immobilization was achieved with BodyFix custom mold.  The same technique was used for the initial and boost phases of treatment.  Narrative: The patient tolerated radiation treatment relatively well.   The patient experienced some minor urinary irritation and modest fatigue.  He experienced significant diarrhea and abdominal cramping/bloating beginning in the third week of treatment and persisting throughout the course of treatment despite scheduled dosing of Lomotil and Imodium AD.  He was also given a trial of cholestyramine which seemed to provide temporary relief initially, but no relief was sustained.  He became dehydrated due to persistent diarrhea and was given IV fluids in the clinic on two separate occasions which he benefited from tremendously.  Due to persistent diarrhea even at completion of treatment, a referral was made to Dr.  Owens Loffler for further evaluation and management.  Plan: The patient has completed radiation treatment. He will return to radiation oncology clinic for routine followup in one month. I advised him to call or return sooner if he has any questions or concerns related to his recovery or treatment. ________________________________  Sheral Apley. Tammi Klippel, M.D.

## 2017-03-14 NOTE — Progress Notes (Signed)
Chief Complaint: Diarrhea  HPI:     Rodney Patterson is an 83 year old Caucasian male with a past medical history as listed below including recently treated prostate cancer, who was referred to me by Lavone Orn, MD for a complaint of diarrhea.      Today, the patient presents to clinic accompanied by his wife and they explain that patient has previously been followed by [facility].  He has had a colonoscopy and also had a Cologuard last summer which was negative.  Patient complains today of 2 months of diarrhea.  Patient describes that this all started after he initiated radiation for prostate cancer.  It worsened with every treatment.  The patient tells me that at one-point he was having a bowel movement every 30 minutes.  This seemed to be worse with any foods which were not bland per his wife.  Most recently, patient spoke with someone in our clinic who instructed him to take 2 Imodium and 2 Lomotil in the morning and again at 1:00 if he continued with loose stools.  He could repeat this again later in the evening if he continued with loose stools, but if he did not he would not take his evening dose.  Patient tells me that yesterday morning he had a small amount of diarrhea and took his "4 pills", but he had no further diarrhea since then and has not had any this morning.  Patient tells me over the past 3 days these bowel movements seem to be slowing down.  His last radiation treatment was November 1.  He was told that "the radiation would be out of my body by this Thursday".  Patient denies seeing any bright red blood or having any rectal or abdominal pain.  He has lost a total of 25 pounds over the past 2 months.    Patient denies fever, chills, melena, anorexia, nausea or vomiting.  Past Medical History:  Diagnosis Date  . Bilateral tinnitus   . Carotid stenosis, bilateral    per duplex 02-11-2016  bilateral ICA less than50% stenosis  . CKD (chronic kidney disease) stage 3, GFR 30-59 ml/min (HCC)     . Erectile dysfunction   . History of hypertension    per pt pcp took him off medication approx. 2016  . Hyperlipidemia   . Hyperplasia of prostate with lower urinary tract symptoms (LUTS)   . Mild acid reflux    watches diet  . Nocturia more than twice per night   . Postcholecystectomy diarrhea   . Prostate cancer [facility]) urologist-  dr wrenn/ oncologist-  dr Tammi Klippel   dx 06/ 2018-- Stage cT2c, Gleason 3+4, PSA10.1, vol 98cc-- plant external beam radiation  . Seasonal allergic rhinitis   . Wears glasses   . Wears partial dentures    upper  . White coat syndrome without diagnosis of hypertension     Past Surgical History:  Procedure Laterality Date  . CATARACT EXTRACTION W/ INTRAOCULAR LENS  IMPLANT, BILATERAL  2016 approx.  . COLONOSCOPY  last one 02-17-2004  . LAPAROSCOPIC CHOLECYSTECTOMY  01-18-2003  dr Margot Chimes  . PROSTATE BIOPSY  10-26-2016  dr Jeffie Pollock (office)    Current Outpatient Medications  Medication Sig Dispense Refill  . aspirin 81 MG tablet Take 81 mg by mouth daily.    . diphenoxylate-atropine (LOMOTIL) 2.5-0.025 MG tablet Take 2 tablets 3 (three) times daily as needed by mouth for diarrhea or loose stools. 90 tablet 2  . ondansetron (ZOFRAN) 8 MG tablet Take 1  tablet (8 mg total) by mouth every 8 (eight) hours as needed for nausea or vomiting. 90 tablet 1  . Cholecalciferol (VITAMIN D3) 5000 units CAPS Take 1 capsule by mouth every morning.    . Flaxseed, Linseed, (FLAX SEED OIL) 1300 MG CAPS Take 1 capsule by mouth every morning.    . Multiple Vitamins-Minerals (MENS MULTIVITAMIN PLUS PO) Take 1 capsule by mouth daily.    . Omega-3 Fatty Acids (FISH OIL) 1200 MG CAPS Take 1 capsule by mouth every morning.     No current facility-administered medications for this visit.     Allergies as of 03/14/2017 - Review Complete 03/14/2017  Allergen Reaction Noted  . Adhesive [tape] Other (See Comments) 12/16/2016  . Tetanus immune globulin Other (See Comments) 10/31/2013     Family History  Problem Relation Age of Onset  . Cancer Neg Hx   . Esophageal cancer Neg Hx   . Colon cancer Neg Hx     Social History   Socioeconomic History  . Marital status: Married    Spouse name: Not on file  . Number of children: Not on file  . Years of education: Not on file  . Highest education level: Not on file  Social Needs  . Financial resource strain: Not on file  . Food insecurity - worry: Not on file  . Food insecurity - inability: Not on file  . Transportation needs - medical: Not on file  . Transportation needs - non-medical: Not on file  Occupational History  . Not on file  Tobacco Use  . Smoking status: Former Smoker    Years: 10.00    Types: Cigars, Pipe    Last attempt to quit: 05/03/1967    Years since quitting: 49.8  . Smokeless tobacco: Never Used  Substance and Sexual Activity  . Alcohol use: No    Frequency: Never  . Drug use: No  . Sexual activity: Yes  Other Topics Concern  . Not on file  Social History Narrative  . Not on file    Review of Systems:    Constitutional: No fever or chills Skin: No rash Cardiovascular: No chest pain Respiratory: No SOB  Gastrointestinal: See HPI and otherwise negative Genitourinary: No dysuria  Neurological: No headache Musculoskeletal: No new muscle or joint pain Hematologic: No bleeding or bruising Psychiatric: No history of depression or anxiety    Physical Exam:  Vital signs: BP 100/60   Pulse 76   Ht 5\' 11"  (1.803 m)   Wt 156 lb 3.2 oz (70.9 kg)   BMI 21.79 kg/m   Constitutional:   Pleasant Caucasian male appears to be in NAD, Well developed, Well nourished, alert and cooperative Head:  Normocephalic and atraumatic. Eyes:   PEERL, EOMI. No icterus. Conjunctiva pink. Ears:  Normal auditory acuity. Neck:  Supple Throat: Oral cavity and pharynx without inflammation, swelling or lesion.  Respiratory: Respirations even and unlabored. Lungs clear to auscultation bilaterally.   No  wheezes, crackles, or rhonchi.  Cardiovascular: Normal S1, S2. No MRG. Regular rate and rhythm. No peripheral edema, cyanosis or pallor.  Gastrointestinal:  Soft, nondistended, nontender. No rebound or guarding. Normal bowel sounds. No appreciable masses or hepatomegaly. Rectal:  Not performed.  Msk:  Symmetrical without gross deformities. Without edema, no deformity or joint abnormality.  Neurologic:  Alert and  oriented x4;  grossly normal neurologically.  Skin:   Dry and intact without significant lesions or rashes. Psychiatric:  Demonstrates good judgement and reason without abnormal affect or  behaviors.  RELEVANT LABS AND IMAGING: CBC    Component Value Date/Time   WBC 4.5 03/02/2017 1446   RBC 4.07 (L) 03/02/2017 1446   HGB 13.7 03/02/2017 1446   HCT 38.9 03/02/2017 1446   PLT 257 03/02/2017 1446   MCV 95.7 03/02/2017 1446   MCH 33.6 (H) 03/02/2017 1446   MCHC 35.2 03/02/2017 1446   RDW 13.5 03/02/2017 1446   LYMPHSABS 0.5 (L) 03/02/2017 1446   MONOABS 0.6 03/02/2017 1446   EOSABS 0.4 03/02/2017 1446   BASOSABS 0.0 03/02/2017 1446    CMP     Component Value Date/Time   NA 135 (L) 03/02/2017 1446   K 4.3 03/02/2017 1446   CL 103 12/27/2016 1001   CO2 24 03/02/2017 1446   GLUCOSE 98 03/02/2017 1446   BUN 23.0 03/02/2017 1446   CREATININE 1.6 (H) 03/02/2017 1446   CALCIUM 9.2 03/02/2017 1446   PROT 7.4 03/02/2017 1446   ALBUMIN 3.6 03/02/2017 1446   AST 21 03/02/2017 1446   ALT 23 03/02/2017 1446   ALKPHOS 110 03/02/2017 1446   BILITOT 0.71 03/02/2017 1446   Gi pathogen panel: Neg  Assessment: 1.  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  Discussed case with Dr. Ardis Hughs at the time of patient's appointment.  Did recommend that the patient have a flex sigmoidoscopy to confirm a diagnosis of radiation proctitis. The patient tells me his oncologist told him not to have a  sigmoidoscopy at any point in the near future due to his recent radiation and patient tells me he is not eager to have any procedures and his wife is very frustrated that I even mentioned this.  Dr. Ardis Hughs recommends the patient continue Lomotil and Imodium for now as previously instructed.  We will contact the patient's oncologist and see if a flex sigmoidoscopy would be safe for him.  If it would, we will discuss with the patient and his wife. 2.  Patient was given a follow-up appointment with Dr. Ardis Hughs on November 28 at 9:30 AM 3.  Refilled patient's Lomotil 4.  Patient to call our clinic if he has worsening of symptoms before then.  We did briefly discuss that this could clear on its own with time.  Ellouise Newer, PA-C [facility] 03/14/2017, 1:12 PM  Cc: Lavone Orn, MD

## 2017-03-15 NOTE — Progress Notes (Signed)
Jennifer Thanks.  I agree with note, plan.  Thanks also for checking with Dr. Tammi Klippel who advised it is certainly OK to proceed with flex sig/colonoscopy if we feel that is needed.  I spoke with Mr. Javid this morning. His diarrhea is much improved over the past few days. He's having solid stools and requiring one 2-3 lomotil the past 2 days at least.  Hopefully that trend will continue.  He has appt with me in 2 weeks and knows to keep that appt unless his bowels are absolutely back to normal and he is no longer requiring anti-diarrheals.  Lupita Shutter, FYI. Thanks

## 2017-03-21 ENCOUNTER — Telehealth: Payer: Self-pay | Admitting: Radiation Oncology

## 2017-03-21 NOTE — Telephone Encounter (Signed)
Received refill fax request from [facility] for cholestyramine. Marked refill request as not authorization and provided instruction for patient to consult with GI doctor for refill. Fax confirmation received that response was delivered.

## 2017-03-23 ENCOUNTER — Other Ambulatory Visit: Payer: Self-pay | Admitting: Physician Assistant

## 2017-03-28 ENCOUNTER — Telehealth: Payer: Self-pay | Admitting: Radiation Oncology

## 2017-03-28 ENCOUNTER — Ambulatory Visit: Payer: Medicare Other | Admitting: Physician Assistant

## 2017-03-28 NOTE — Telephone Encounter (Signed)
Faxed second response refusing to authorize refill of cholestyram. Provided instructions again requesting patient seek refill from gastroenterologist. Fax confirmation of delivery obtained. In addition, requested to be removed from fax list requesting refill for this medication.

## 2017-03-29 ENCOUNTER — Ambulatory Visit: Payer: Medicare Other | Admitting: Gastroenterology

## 2017-04-04 ENCOUNTER — Ambulatory Visit
Admission: RE | Admit: 2017-04-04 | Discharge: 2017-04-04 | Disposition: A | Discharge status: Home / Self Care | Payer: Medicare Other | Source: Ambulatory Visit | Attending: Radiation Oncology | Admitting: Radiation Oncology

## 2017-04-04 ENCOUNTER — Other Ambulatory Visit: Payer: Self-pay

## 2017-04-04 ENCOUNTER — Encounter: Payer: Self-pay | Admitting: Urology

## 2017-04-04 VITALS — BP 138/78 | HR 54 | Temp 97.8°F | Resp 20 | Ht 71.0 in | Wt 161.0 lb

## 2017-04-04 DIAGNOSIS — R197 Diarrhea, unspecified: Secondary | ICD-10-CM | POA: Insufficient documentation

## 2017-04-04 DIAGNOSIS — E785 Hyperlipidemia, unspecified: Secondary | ICD-10-CM | POA: Diagnosis not present

## 2017-04-04 DIAGNOSIS — Z510 Encounter for antineoplastic radiation therapy: Secondary | ICD-10-CM | POA: Insufficient documentation

## 2017-04-04 DIAGNOSIS — I129 Hypertensive chronic kidney disease with stage 1 through stage 4 chronic kidney disease, or unspecified chronic kidney disease: Secondary | ICD-10-CM | POA: Diagnosis not present

## 2017-04-04 DIAGNOSIS — N529 Male erectile dysfunction, unspecified: Secondary | ICD-10-CM | POA: Insufficient documentation

## 2017-04-04 DIAGNOSIS — K5730 Diverticulosis of large intestine without perforation or abscess without bleeding: Secondary | ICD-10-CM | POA: Insufficient documentation

## 2017-04-04 DIAGNOSIS — Z87891 Personal history of nicotine dependence: Secondary | ICD-10-CM | POA: Diagnosis not present

## 2017-04-04 DIAGNOSIS — C61 Malignant neoplasm of prostate: Secondary | ICD-10-CM | POA: Insufficient documentation

## 2017-04-04 DIAGNOSIS — K219 Gastro-esophageal reflux disease without esophagitis: Secondary | ICD-10-CM | POA: Insufficient documentation

## 2017-04-04 DIAGNOSIS — N183 Chronic kidney disease, stage 3 (moderate): Secondary | ICD-10-CM | POA: Insufficient documentation

## 2017-04-04 NOTE — Progress Notes (Signed)
Radiation Oncology         [phone]) [phone] ________________________________  Name: Rodney Patterson MRN: [id number]  Date: 04/04/2017  DOB: 1932/06/01  Post Treatment Note  CC: Lavone Orn, MD  Irine Seal, MD  Diagnosis:   83 y.o.gentleman with Stage cT2aadenocarcinoma of the prostate with Gleason Score of 4+3 and PSA of10.1.  Interval Since Last Radiation:  4.5 weeks  01/05/17 - 03/02/17:   The prostate was treated to 75 Gy in 40 fractions. Prostate and pelvic lymph nodes received 45 Gy in 25 fractions using photons with an IMRT technique. The Prostate was boosted with 30 Gy in 15 fractions using photons with an IMRT technique for a total of 75 Gy  Narrative:  The patient returns today for routine follow-up.  He tolerated radiation treatment relatively well.   He experienced some minor urinary irritation and modest fatigue.  He experienced significant diarrhea and abdominal cramping/bloating beginning in the third week of treatment and persisting throughout the course of treatment despite scheduled dosing of Lomotil and Imodium AD.  He was also given a trial of cholestyramine which seemed to provide temporary relief initially, but no relief was sustained.  He became dehydrated due to persistent diarrhea and was given IV fluids in the clinic on two separate occasions which he benefited from tremendously.  Due to persistent diarrhea even at completion of treatment, a referral was made to Dr. Owens Loffler for further evaluation and management.                             On review of systems, the patient states that he is doing well overall. Within the last 2 days, his bowel routine has returned to normal. He will occasionally have a bout of diarrhea if he eats salads or fatty meals but otherwise is back to a normal schedule regarding bowel habits. He reports nocturia 3-4 times a night which is his normal baseline prior to treatment. He denies frequency, dysuria, gross hematuria, urgency,  incontinence, weak stream or incomplete emptying. He reports a healthy appetite and has gained 6 pounds since completing treatment. He has also noticed a gradual return in his energy which he is quite please with.  ALLERGIES:  is allergic to adhesive [tape] and tetanus immune globulin.  Meds: Current Outpatient Medications  Medication Sig Dispense Refill  . aspirin 81 MG tablet Take 81 mg by mouth daily.    . Cholecalciferol (VITAMIN D3) 5000 units CAPS Take 1 capsule by mouth every morning.    . cholestyramine (QUESTRAN) 4 g packet TAKE 1 PACKET (4 G TOTAL) BY MOUTH 2 (TWO) TIMES DAILY. 60 packet 0  . Flaxseed, Linseed, (FLAX SEED OIL) 1300 MG CAPS Take 1 capsule by mouth every morning.    . Multiple Vitamins-Minerals (MENS MULTIVITAMIN PLUS PO) Take 1 capsule by mouth daily.    . Omega-3 Fatty Acids (FISH OIL) 1200 MG CAPS Take 1 capsule by mouth every morning.    . diphenoxylate-atropine (LOMOTIL) 2.5-0.025 MG tablet Take 2 tablets 3 (three) times daily as needed by mouth for diarrhea or loose stools. (Patient not taking: Reported on 04/04/2017) 90 tablet 2  . ondansetron (ZOFRAN) 8 MG tablet Take 1 tablet (8 mg total) by mouth every 8 (eight) hours as needed for nausea or vomiting. (Patient not taking: Reported on 04/04/2017) 90 tablet 1   No current facility-administered medications for this encounter.     Physical Findings:  height is 5\' 11"  (  1.803 m) and weight is 161 lb (73 kg). His oral temperature is 97.8 F (36.6 C). His blood pressure is 138/78 and his pulse is 54 (abnormal). His respiration is 20 and oxygen saturation is 100%.  Pain Assessment Pain Score: 0-No pain/10 In general this is a well appearing Caucasian male acute distress. He is alert and oriented x4 and appropriate throughout the examination. Cardiopulmonary assessment is negative for acute distress and he exhibits normal effort.   Lab Findings: Lab Results  Component Value Date   WBC 4.5 03/02/2017   HGB 13.7  03/02/2017   HCT 38.9 03/02/2017   MCV 95.7 03/02/2017   PLT 257 03/02/2017     Radiographic Findings: No results found.  Impression/Plan: 1. 83 y.o.gentleman with Stage cT2aadenocarcinoma of the prostate with Gleason Score of 4+3 and PSA of10.1.   He will continue to follow up with urology for ongoing PSA determinations and has an appointment scheduled with Dr. Jeffie Pollock in February 2019. He understands what to expect with regards to PSA monitoring going forward. I will look forward to following his response to treatment via correspondence with urology, and would be happy to continue to participate in his care if clinically indicated. I talked to the patient about what to expect in the future, including his risk for erectile dysfunction and rectal bleeding. I encouraged him to call or return to the office if he has any questions regarding his previous radiation or possible radiation side effects. He was comfortable with this plan and will follow up as needed.    Nicholos Johns, PA-C

## 2017-07-11 ENCOUNTER — Ambulatory Visit: Payer: Medicare Other | Admitting: Physician Assistant

## 2019-12-23 ENCOUNTER — Ambulatory Visit (INDEPENDENT_AMBULATORY_CARE_PROVIDER_SITE_OTHER): Payer: Medicare Other | Admitting: Gastroenterology

## 2019-12-23 ENCOUNTER — Encounter: Payer: Self-pay | Admitting: Gastroenterology

## 2019-12-23 VITALS — BP 170/82 | HR 71 | Ht 71.0 in | Wt 173.2 lb

## 2019-12-23 DIAGNOSIS — R195 Other fecal abnormalities: Secondary | ICD-10-CM | POA: Diagnosis not present

## 2019-12-23 NOTE — Patient Instructions (Addendum)
If you are age 83 or older, your body mass index should be between 23-30. Your Body mass index is 24.16 kg/m. If this is out of the aforementioned range listed, please consider follow up with your Primary Care Provider.  If you are age 29 or younger, your body mass index should be between 19-25. Your Body mass index is 24.16 kg/m. If this is out of the aformentioned range listed, please consider follow up with your Primary Care Provider.   Please purchase the following medications over the counter and take as directed:  START: taking citrucel (orange flavored) powder fiber supplement.  This may cause some bloating at first but that usually goes away. Begin with a small spoonful and work your way up to a large, heaping spoonful daily over a week.  You can use imodium on an as needed basis.  STOP: Questran STOP: lomotil STOP: flax seed oil  Please call our office in 4-5 weeks to let us know how you are feeling.  We will see you back in 3 months (November 2021).  Thank you for entrusting me with your care and choosing [facility]  Dr Ardis Hughs

## 2019-12-23 NOTE — Progress Notes (Signed)
HPI: This is a very pleasant 86 year old man   He was last here almost 3 years ago and he saw [place].  This is the first time I am seeing him.  At the time of his last visit he was there to discuss diarrhea.  It has been going on for about 2 months.  GI pathogen panel was negative.  He explained that it all started shortly after he started prostate cancer radiation.  He was considered for flexible sigmoidoscopy however he was not very interested in that.  Instead we asked him to follow-up with [place] 2 or 3 weeks later.  We have not heard from him since.  Prior to that he was a patient at [facility] gastroenterology.  I see he carries the diagnosis of "postcholecystectomy diarrhea" but I am not sure exactly when that was made her bowels move.  Today he tells me that he has continued to have loose stools for the past 2 or 3 years.  Generally once a week he will have a solid stool which is then quickly followed by a lot of loose stools.  He can sometimes have abdominal discomforts before this.  The other days of the week he is really not bothered by bowel troubles.  He has had no bleeding.  His weight is overall stable.  He and I went over his med list a few times.  He takes cholestyramine twice daily only once in a while however.  He thinks this was prescribed for high cholesterol.  When he takes his he also takes either an Imodium or Lomotil with it.  This is just the way he was told to take it many many years ago.  He has Lomotil on his med list he has flaxseed on his med list, he has Imodium on his med list.  He takes some of them some of the time.  ROS: complete GI ROS as described in HPI, all other review negative.  Constitutional:  No unintentional weight loss   Past Medical History:  Diagnosis Date  . Bilateral tinnitus   . Carotid stenosis, bilateral    per duplex 02-11-2016  bilateral ICA less than50% stenosis  . CKD (chronic kidney disease) stage 3, GFR 30-59 ml/min   . Erectile  dysfunction   . History of hypertension    per pt pcp took him off medication approx. 2016  . Hyperlipidemia   . Hyperplasia of prostate with lower urinary tract symptoms (LUTS)   . Mild acid reflux    watches diet  . Nocturia more than twice per night   . Postcholecystectomy diarrhea   . Prostate cancer [facility]) urologist-  dr wrenn/ oncologist-  dr Tammi Klippel   dx 06/ 2018-- Stage cT2c, Gleason 3+4, PSA10.1, vol 98cc-- plant external beam radiation  . Seasonal allergic rhinitis   . Wears glasses   . Wears partial dentures    upper  . White coat syndrome without diagnosis of hypertension     Past Surgical History:  Procedure Laterality Date  . CATARACT EXTRACTION W/ INTRAOCULAR LENS  IMPLANT, BILATERAL  2016 approx.  . COLONOSCOPY  last one 02-17-2004  . GOLD SEED IMPLANT N/A 12/27/2016   Procedure: GOLD SEED IMPLANT WITH SPACE OAR;  Surgeon: Irine Seal, MD;  Location: [facility];  Service: Urology;  Laterality: N/A;  . LAPAROSCOPIC CHOLECYSTECTOMY  01-18-2003  dr Margot Chimes  . PROSTATE BIOPSY  10-26-2016  dr Jeffie Pollock (office)    Current Outpatient Medications  Medication Sig Dispense Refill  .  AMBULATORY NON FORMULARY MEDICATION Medication Name: Olipure- OTC blood pressure med- Use as needed    . aspirin 81 MG tablet Take 81 mg by mouth daily.    . Cholecalciferol (VITAMIN D3) 5000 units CAPS Take 1 capsule by mouth every morning.    . cholestyramine (QUESTRAN) 4 g packet TAKE 1 PACKET (4 G TOTAL) BY MOUTH 2 (TWO) TIMES DAILY. 60 packet 0  . diphenoxylate-atropine (LOMOTIL) 2.5-0.025 MG tablet Take 2 tablets 3 (three) times daily as needed by mouth for diarrhea or loose stools. 90 tablet 2  . Flaxseed, Linseed, (FLAX SEED OIL) 1300 MG CAPS Take 1 capsule by mouth every morning.    . loperamide (IMODIUM) 2 MG capsule Take 2 mg by mouth daily as needed for diarrhea or loose stools.    . Multiple Vitamins-Minerals (MENS MULTIVITAMIN PLUS PO) Take 1 capsule by mouth daily.    .  Omega-3 Fatty Acids (FISH OIL) 1200 MG CAPS Take 1 capsule by mouth every morning.    . pravastatin (PRAVACHOL) 20 MG tablet Take 20 mg by mouth daily.    . Psyllium (METAMUCIL FIBER PO) Take by mouth as directed.     No current facility-administered medications for this visit.    Allergies as of 12/23/2019 - Review Complete 12/23/2019  Allergen Reaction Noted  . Adhesive [tape] Other (See Comments) 12/16/2016  . Tetanus immune globulin Other (See Comments) 10/31/2013    Family History  Problem Relation Age of Onset  . Cancer Neg Hx   . Esophageal cancer Neg Hx   . Colon cancer Neg Hx     Social History   Socioeconomic History  . Marital status: Married    Spouse name: Not on file  . Number of children: Not on file  . Years of education: Not on file  . Highest education level: Not on file  Occupational History  . Not on file  Tobacco Use  . Smoking status: Former Smoker    Years: 10.00    Types: 18, Pipe    Quit date: 05/03/1967    Years since quitting: 52.6  . Smokeless tobacco: Never Used  Vaping Use  . Vaping Use: Never used  Substance and Sexual Activity  . Alcohol use: Yes    Comment: occasional  . Drug use: No  . Sexual activity: Yes  Other Topics Concern  . Not on file  Social History Narrative  . Not on file   Social Determinants of Health   Financial Resource Strain:   . Difficulty of Paying Living Expenses: Not on file  Food Insecurity:   . Worried About Charity fundraiser in the Last Year: Not on file  . Ran Out of Food in the Last Year: Not on file  Transportation Needs:   . Lack of Transportation (Medical): Not on file  . Lack of Transportation (Non-Medical): Not on file  Physical Activity:   . Days of Exercise per Week: Not on file  . Minutes of Exercise per Session: Not on file  Stress:   . Feeling of Stress : Not on file  Social Connections:   . Frequency of Communication with Friends and Family: Not on file  . Frequency of Social  Gatherings with Friends and Family: Not on file  . Attends Religious Services: Not on file  . Active Member of Clubs or Organizations: Not on file  . Attends Archivist Meetings: Not on file  . Marital Status: Not on file  Intimate Partner Violence:   .  Fear of Current or Ex-Partner: Not on file  . Emotionally Abused: Not on file  . Physically Abused: Not on file  . Sexually Abused: Not on file     Physical Exam: BP (!) 170/82   Pulse 71   Ht 5\' 11"  (1.803 m)   Wt 173 lb 3.2 oz (78.6 kg)   SpO2 100%   BMI 24.16 kg/m  Constitutional: generally well-appearing Psychiatric: alert and oriented x3 Abdomen: soft, nontender, nondistended, no obvious ascites, no peritoneal signs, normal bowel sounds No peripheral edema noted in lower extremities  Assessment and plan: 86 y.o. male with intermittent loose stools  Family thinks this started around the time of radiation to his prostate and that may indeed be the case.  I believe it has been at least 10 years since his last colonoscopy evaluation and he and I discussed this.  He is not at all interested in a colonoscopy and he would not consent to having one.  Instead he wants to try to adjust some of his medicines or his diet so that his bowels are not so bothersome once a week.  To this end I recommend he stop taking his Questran, stop taking his Lomotil.  Stop taking his flaxseed.  Instead he is going to start taking Citrucel fiber supplement once daily and he is going to call me in 4 to 5 weeks to let me know how he is doing.  He knows that he can use Imodium as needed as a rescue if needed between now and then.  He will call in 4 5 weeks and we will arrange for return office visit in 3 months.  Please see the "Patient Instructions" section for addition details about the plan.  Owens Loffler, MD [facility] 12/23/2019, 3:31 PM   Total time on date of encounter was 30 minutes (this included time spent preparing to  see the patient reviewing records; obtaining and/or reviewing separately obtained history; performing a medically appropriate exam and/or evaluation; counseling and educating the patient and family if present; ordering medications, tests or procedures if applicable; and documenting clinical information in the health record).

## 2020-03-20 ENCOUNTER — Ambulatory Visit: Payer: Medicare Other | Admitting: Gastroenterology

## 2021-03-08 ENCOUNTER — Other Ambulatory Visit (HOSPITAL_COMMUNITY): Payer: Self-pay | Admitting: Internal Medicine

## 2021-03-08 DIAGNOSIS — I350 Nonrheumatic aortic (valve) stenosis: Secondary | ICD-10-CM

## 2021-04-12 ENCOUNTER — Ambulatory Visit (HOSPITAL_COMMUNITY): Payer: Medicare Other | Attending: Cardiology

## 2021-04-12 ENCOUNTER — Encounter (HOSPITAL_COMMUNITY): Payer: Self-pay

## 2021-04-12 ENCOUNTER — Other Ambulatory Visit: Payer: Self-pay

## 2021-04-12 DIAGNOSIS — I350 Nonrheumatic aortic (valve) stenosis: Secondary | ICD-10-CM

## 2021-04-12 LAB — ECHOCARDIOGRAM COMPLETE
AR max vel: 1.04 cm2
AV Area VTI: 1.09 cm2
AV Area mean vel: 1.11 cm2
AV Mean grad: 69 mmHg
AV Peak grad: 119.7 mmHg
Ao pk vel: 5.47 m/s
Area-P 1/2: 2.03 cm2
P 1/2 time: 504 msec
S' Lateral: 3.15 cm

## 2021-04-12 NOTE — Progress Notes (Signed)
Urgent findings reported to Dr. Burt Knack Urgent findings reported time 11:55 Urgent findings acknowledge Yes Disposition of patient at discharge (if patient is discharged) Stable to be discharged

## 2021-04-14 ENCOUNTER — Other Ambulatory Visit: Payer: Self-pay

## 2021-04-14 ENCOUNTER — Encounter: Payer: Self-pay | Admitting: Cardiovascular Disease

## 2021-04-14 ENCOUNTER — Ambulatory Visit (INDEPENDENT_AMBULATORY_CARE_PROVIDER_SITE_OTHER): Payer: Medicare Other | Admitting: Cardiovascular Disease

## 2021-04-14 VITALS — BP 160/90 | HR 54 | Ht 71.5 in | Wt 182.0 lb

## 2021-04-14 DIAGNOSIS — I350 Nonrheumatic aortic (valve) stenosis: Secondary | ICD-10-CM

## 2021-04-14 LAB — BASIC METABOLIC PANEL
BUN/Creatinine Ratio: 9 — ABNORMAL LOW (ref 10–24)
BUN: 12 mg/dL (ref 8–27)
CO2: 24 mmol/L (ref 20–29)
Calcium: 9.4 mg/dL (ref 8.6–10.2)
Chloride: 100 mmol/L (ref 96–106)
Creatinine, Ser: 1.3 mg/dL — ABNORMAL HIGH (ref 0.76–1.27)
Glucose: 99 mg/dL (ref 70–99)
Potassium: 4.2 mmol/L (ref 3.5–5.2)
Sodium: 139 mmol/L (ref 134–144)
eGFR: 54 mL/min/{1.73_m2} — ABNORMAL LOW (ref >59)

## 2021-04-14 LAB — CBC
Hematocrit: 41.1 % (ref 37.5–51.0)
Hemoglobin: 14.3 g/dL (ref 13.0–17.7)
MCH: 34 pg — ABNORMAL HIGH (ref 26.6–33.0)
MCHC: 34.8 g/dL (ref 31.5–35.7)
MCV: 98 fL — ABNORMAL HIGH (ref 79–97)
Platelets: 220 10*3/uL (ref 150–450)
RBC: 4.2 x10E6/uL (ref 4.14–5.80)
RDW: 11.2 % — ABNORMAL LOW (ref 11.6–15.4)
WBC: 7.7 10*3/uL (ref 3.4–10.8)

## 2021-04-14 NOTE — Progress Notes (Signed)
Pre Surgical Assessment: 5 M Walk Test  1M=16.24ft  5 Meter Walk Test- trial 1: 3.93 seconds 5 Meter Walk Test- trial 2: 4.59 seconds 5 Meter Walk Test- trial 3: 4.53 seconds 5 Meter Walk Test Average: 4.35 seconds

## 2021-04-14 NOTE — Patient Instructions (Addendum)
Medication Instructions:  Your physician recommends that you continue on your current medications as directed. Please refer to the Current Medication list given to you today.  *If you need a refill on your cardiac medications before your next appointment, please call your pharmacy*   Lab Work: BMET and CBC today  If you have labs (blood work) drawn today and your tests are completely normal, you will receive your results only by: [place] (if you have MyChart) OR A paper copy in the mail If you have any lab test that is abnormal or we need to change your treatment, we will call you to review the results.   Testing/Procedures: Your physician has requested that you have a cardiac catheterization. Cardiac catheterization is used to diagnose and/or treat various heart conditions. Doctors may recommend this procedure for a number of different reasons. The most common reason is to evaluate chest pain. Chest pain can be a symptom of coronary artery disease (CAD), and cardiac catheterization can show whether plaque is narrowing or blocking your hearts arteries. This procedure is also used to evaluate the valves, as well as measure the blood flow and oxygen levels in different parts of your heart. For further information please visit HugeFiesta.[email or web address]. Please follow instruction sheet, as given.     Follow-Up: The structural team will be in touch with next steps 1}    Other Instructions   [place] OFFICE [place], [place] [address] Dept: [phone] Loc: [phone]  Lerry Cordrey  04/14/2021  You are scheduled for a Cardiac Catheterization on Thursday, December 29 with Dr. [name].  1. Please arrive at the [facility] (Main Entrance A) at [facility]: [address] [place], [place] [address] at 5:30 AM (This time is two hours before your procedure to ensure  your preparation). Free valet parking service is available.   Special note: Every effort is made to have your procedure done on time. Please understand that emergencies sometimes delay scheduled procedures.  2. Diet: Do not eat solid foods after midnight.  The patient may have clear liquids until 5am upon the day of the procedure.  3. Labs: You will have labs drawn today.  4. Medication instructions in preparation for your procedure:   Contrast Allergy: No   On the morning of your procedure, take your Aspirin and any morning medicines NOT listed above.  You may use sips of water.  5. Plan for one night stay--bring personal belongings. 6. Bring a current list of your medications and current insurance cards. 7. You MUST have a responsible person to drive you home. 8. Someone MUST be with you the first 24 hours after you arrive home or your discharge will be delayed. 9. Please wear clothes that are easy to get on and off and wear slip-on shoes.  Thank you for allowing [place] to care for you!   -- [place] Invasive Cardiovascular services

## 2021-04-14 NOTE — Progress Notes (Signed)
Cardiology Office Note:    Date:  04/14/2021   ID:  Rodney Patterson, DOB Jan 19, 1933, MRN [id number]  PCP:  Lavone Orn, MD   [facility] Providers Cardiologist:  None     Referring MD: Lavone Orn, MD   Chief Complaint  Patient presents with   Shortness of Breath     History of Present Illness:    Rodney Patterson is a 87 y.o. male referred by Dr Laurann Montana for evaluation of aortic stenosis.   He is here with his wife today. He has been a healthy, active person. He has always been active with hunting and fishing. He exercises on a treadmill and previously walked for at least 30 minutes, but more recently he has had to reduce the speed of the treadmill and can't walk very long now. He went quail hunting recently and felt very limited in his activities. He states that he 'can't get the oxygen' with activity now. Describes shortness of breath with exertion at any moderate level. Symptoms have been present for about one year now. He denies lightheadedness, syncope, chest pain, or chest pressure.   He has known of a heart murmur for a few years, but recently was referred for an echocardiogram because of his progressive symptoms and heart murmur.   The patient has had regular dental care, partial dentures, no current problems.   Past Medical History:  Diagnosis Date   Bilateral tinnitus    Carotid stenosis, bilateral    per duplex 02-11-2016  bilateral ICA less than50% stenosis   CKD (chronic kidney disease) stage 3, GFR 30-59 ml/min (HCC)    Erectile dysfunction    History of hypertension    per pt pcp took him off medication approx. 2016   Hyperlipidemia    Hyperplasia of prostate with lower urinary tract symptoms (LUTS)    Mild acid reflux    watches diet   Nocturia more than twice per night    Postcholecystectomy diarrhea    Prostate cancer [facility]) urologist-  dr wrenn/ oncologist-  dr Tammi Klippel   dx 06/ 2018-- Stage cT2c, Gleason 3+4, PSA10.1, vol 98cc-- plant external beam  radiation   Seasonal allergic rhinitis    Wears glasses    Wears partial dentures    upper   White coat syndrome without diagnosis of hypertension     Past Surgical History:  Procedure Laterality Date   CATARACT EXTRACTION W/ INTRAOCULAR LENS  IMPLANT, BILATERAL  2016 approx.   COLONOSCOPY  last one 02-17-2004   GOLD SEED IMPLANT N/A 12/27/2016   Procedure: GOLD SEED IMPLANT WITH SPACE OAR;  Surgeon: Irine Seal, MD;  Location: [facility];  Service: Urology;  Laterality: N/A;   LAPAROSCOPIC CHOLECYSTECTOMY  01-18-2003  dr Margot Chimes   PROSTATE BIOPSY  10-26-2016  dr Jeffie Pollock (office)    Current Medications: Current Meds  Medication Sig   AMBULATORY NON FORMULARY MEDICATION Medication Name: Olipure- OTC blood pressure med- Use as needed   amLODipine (NORVASC) 5 MG tablet Take 5 mg by mouth daily.   aspirin 81 MG tablet Take 81 mg by mouth daily.   betamethasone dipropionate 0.05 % cream Apply topically 2 (two) times daily as needed.   Cholecalciferol (VITAMIN D3) 5000 units CAPS Take 1 capsule by mouth every morning.   dorzolamide (TRUSOPT) 2 % ophthalmic solution 1 drop 2 (two) times daily.   loperamide (IMODIUM) 2 MG capsule Take 2 mg by mouth daily as needed for diarrhea or loose stools.   Multiple Vitamins-Minerals (MENS MULTIVITAMIN  PLUS PO) Take 1 capsule by mouth daily.   Omega-3 Fatty Acids (FISH OIL) 1200 MG CAPS Take 1 capsule by mouth every morning.   pravastatin (PRAVACHOL) 20 MG tablet Take 20 mg by mouth daily.   Psyllium (METAMUCIL FIBER PO) Take by mouth as directed.   tadalafil (CIALIS) 20 MG tablet SMARTSIG:1 Tablet(s) By Mouth Every 3 Days PRN   timolol (TIMOPTIC) 0.5 % ophthalmic solution SMARTSIG:In Eye(s)     Allergies:   Adhesive [tape] and Tetanus immune globulin   Social History   Socioeconomic History   Marital status: Married    Spouse name: Not on file   Number of children: Not on file   Years of education: Not on file   Highest  education level: Not on file  Occupational History   Not on file  Tobacco Use   Smoking status: Former    Types: Cigars, Pipe    Quit date: 05/03/1967    Years since quitting: 53.9   Smokeless tobacco: Never  Vaping Use   Vaping Use: Never used  Substance and Sexual Activity   Alcohol use: Yes    Comment: occasional   Drug use: No   Sexual activity: Yes  Other Topics Concern   Not on file  Social History Narrative   Not on file   Social Determinants of Health   Financial Resource Strain: Not on file  Food Insecurity: Not on file  Transportation Needs: Not on file  Physical Activity: Not on file  Stress: Not on file  Social Connections: Not on file     Family History: The patient's family history is negative for Cancer, Esophageal cancer, and Colon cancer.  ROS:   Please see the history of present illness.    Hx prostrate radiation, urinary incontinence. All other systems reviewed and are negative.  EKGs/Labs/Other Studies Reviewed:    The following studies were reviewed today: Echocardiogram: 1. There is severe aortic stenosis with AVA 0.96cm2, mean gradient  33mmHg, peak gradient 168mmHg, Vmax 5.58m/s, DI 0.24 (LVOT VTI 31).   2. Left ventricular ejection fraction, by estimation, is 60 to 65%. The  left ventricle has normal function. The left ventricle has no regional  wall motion abnormalities. There is mild concentric left ventricular  hypertrophy. Left ventricular diastolic  parameters are consistent with Grade I diastolic dysfunction (impaired  relaxation). Elevated left atrial pressure. The average left ventricular  global longitudinal strain is -21.7 %. The global longitudinal strain is  normal.   3. Right ventricular systolic function is normal. The right ventricular  size is normal.   4. Left atrial size was moderately dilated.   5. The mitral valve is abnormal. Trivial mitral valve regurgitation.  Moderate mitral annular calcification.   6. The aortic  valve was not well visualized. There is severe calcifcation  of the aortic valve. There is severe thickening of the aortic valve.  Aortic valve regurgitation is mild. Severe aortic valve stenosis.   7. The inferior vena cava is normal in size with greater than 50%  respiratory variability, suggesting right atrial pressure of 3 mmHg.   Comparison(s): No prior Echocardiogram.   LEFT VENTRICLE  PLAX 2D  LVIDd:         4.70 cm   Diastology  LVIDs:         3.15 cm   LV e' medial:    5.33 cm/s  LV PW:         0.90 cm   LV E/e' medial:  18.5  LV IVS:        1.10 cm   LV e' lateral:   6.75 cm/s  LVOT diam:     2.37 cm   LV E/e' lateral: 14.6  LV SV:         141  LV SV Index:   71        2D Longitudinal Strain  LVOT Area:     4.42 cm  2D Strain GLS (A2C):   -22.1 %                           2D Strain GLS (A3C):   -19.8 %                           2D Strain GLS (A4C):   -23.1 %                           2D Strain GLS Avg:     -21.7 %                              3D Volume EF:                           3D EF:        56 %                           LV EDV:       144 ml                           LV ESV:       64 ml                           LV SV:        80 ml   RIGHT VENTRICLE             IVC  RV Basal diam:  3.20 cm     IVC diam: 1.60 cm  RV S prime:     13.50 cm/s  TAPSE (M-mode): 2.1 cm   LEFT ATRIUM             Index        RIGHT ATRIUM           Index  LA diam:        4.50 cm 2.27 cm/m   RA Pressure: 3.00 mmHg  LA Vol (A2C):   80.3 ml 40.48 ml/m  RA Area:     12.80 cm  LA Vol (A4C):   63.9 ml 32.22 ml/m  RA Volume:   25.80 ml  13.01 ml/m  LA Biplane Vol: 71.2 ml 35.90 ml/m   AORTIC VALVE  AV Area (Vmax):    1.04 cm  AV Area (Vmean):   1.11 cm  AV Area (VTI):     1.09 cm  AV Vmax:           547.00 cm/s  AV Vmean:          358.000 cm/s  AV VTI:            1.290 m  AV Peak Grad:  119.7 mmHg  AV Mean Grad:      69.0 mmHg  LVOT Vmax:         128.33 cm/s  LVOT Vmean:         90.100 cm/s  LVOT VTI:          0.318 m  LVOT/AV VTI ratio: 0.25  AI PHT:            504 msec     AORTA  Ao Root diam: 3.50 cm  Ao Asc diam:  3.60 cm   MITRAL VALVE                TRICUSPID VALVE  MV Area (PHT): 2.03 cm     Estimated RAP:  3.00 mmHg  MV Peak grad:  8.9 mmHg  MV Mean grad:  3.0 mmHg     SHUNTS  MV Vmax:       1.49 m/s     Systemic VTI:  0.32 m  MV Vmean:      74.3 cm/s    Systemic Diam: 2.37 cm  MV Decel Time: 373 msec  MV E velocity: 98.50 cm/s  MV A velocity: 148.00 cm/s  MV E/A ratio:  0.67   EKG:  EKG is ordered today.  The ekg ordered today demonstrates sinus bradycardia 54 bpm, nonspecific T wave abnormality  Recent Labs: No results found for requested labs within last 8760 hours.  Recent Lipid Panel No results found for: CHOL, TRIG, HDL, CHOLHDL, VLDL, LDLCALC, LDLDIRECT   Risk Assessment/Calculations:     STS Risk Calculator: Risk of Mortality: 1.715% Renal Failure: 2.597% Permanent Stroke: 1.425% Prolonged Ventilation: 4.807% DSW Infection: 0.071% Reoperation: 3.856% Morbidity or Mortality: 9.712% Short Length of Stay: 36.210% Long Length of Stay: 4.253%    Physical Exam:    VS:  BP (!) 160/90    Pulse (!) 54    Ht 5' 11.5" (1.816 m)    Wt 182 lb (82.6 kg)    SpO2 98%    BMI 25.03 kg/m     Wt Readings from Last 3 Encounters:  04/14/21 182 lb (82.6 kg)  12/23/19 173 lb 3.2 oz (78.6 kg)  04/04/17 161 lb (73 kg)     GEN:  Well nourished, well developed in no acute distress HEENT: Normal NECK: No JVD; No carotid bruits LYMPHATICS: No lymphadenopathy CARDIAC: RRR, 3/6 harsh late peaking systolic murmur at the RUSB, absent A2 RESPIRATORY:  Clear to auscultation without rales, wheezing or rhonchi  ABDOMEN: Soft, non-tender, non-distended MUSCULOSKELETAL:  No edema; No deformity  SKIN: Warm and dry NEUROLOGIC:  Alert and oriented x 3 PSYCHIATRIC:  Normal affect   ASSESSMENT:    1. Severe aortic stenosis   2.  Nonrheumatic aortic valve stenosis    PLAN:    In order of problems listed above:  The patient has severe, stage D1 aortic stenosis with New York Heart Association functional class II symptoms of chronic diastolic heart failure with exertional dyspnea and fatigue.  The patient's left ventricular ejection fraction is normal at 60 to 65%.  His aortic valve is severely calcified and restricted. He has critical aortic stenosis with a mean transvalvular gradient of 69 mmHg, peak gradient of 120 mmHg, and peak systolic velocity of 5.5 m/s.  I reviewed the natural history of severe aortic stenosis with the patient and his family members today.  We discussed treatment options at length, including TAVR, surgical AVR, and palliative medical therapy.  The patient is active and highly functional.  He clearly warrants treatment.  TAVR will likely be the preferred treatment strategy in the context of his advanced age of 87 years old.  He understands the need for further diagnostic testing to include right and left heart catheterization with coronary angiography and possible PCI. I have reviewed the risks, indications, and alternatives to cardiac catheterization, possible angioplasty, and stenting with the patient. Risks include but are not limited to bleeding, infection, vascular injury, stroke, myocardial infection, arrhythmia, kidney injury, radiation-related injury in the case of prolonged fluoroscopy use, emergency cardiac surgery, and death. The patient understands the risks of serious complication is 1-2 in 3536 with diagnostic cardiac cath and 1-2% or less with angioplasty/stenting.  He will also undergo CT angiography studies to assess anatomic suitability for TAVR and define vascular access options.  Once the studies are completed, he will be referred for formal cardiac surgical consultation as part of a multidisciplinary approach to his care.  As part of his evaluation today, I reviewed the TAVR procedure in  detail with him.  I demonstrated a procedural animation, discussed each step of the procedure, reviewed potential complications, alternatives, and expected postoperative recovery.  Further plans pending his cardiac catheterization and CT angiography results.      Shared Decision Making/Informed Consent The risks [stroke (1 in 1000), death (1 in 1000), kidney failure [usually temporary] (1 in 500), bleeding (1 in 200), allergic reaction [possibly serious] (1 in 200)], benefits (diagnostic support and management of coronary artery disease) and alternatives of a cardiac catheterization were discussed in detail with Mr. Blann and he is willing to proceed.    Medication Adjustments/Labs and Tests Ordered: Current medicines are reviewed at length with the patient today.  Concerns regarding medicines are outlined above.  Orders Placed This Encounter  Procedures   CT CORONARY MORPH W/CTA COR W/SCORE W/CA W/CM &/OR WO/CM   CT ANGIO ABDOMEN PELVIS  W &/OR WO CONTRAST   CT ANGIO CHEST AORTA W/CM & OR WO/CM   Basic metabolic panel   CBC   EKG 12-Lead   No orders of the defined types were placed in this encounter.   Patient Instructions  Medication Instructions:  Your physician recommends that you continue on your current medications as directed. Please refer to the Current Medication list given to you today.  *If you need a refill on your cardiac medications before your next appointment, please call your pharmacy*   Lab Work: BMET and CBC today  If you have labs (blood work) drawn today and your tests are completely normal, you will receive your results only by: [facility] (if you have MyChart) OR A paper copy in the mail If you have any lab test that is abnormal or we need to change your treatment, we will call you to review the results.   Testing/Procedures: Your physician has requested that you have a cardiac catheterization. Cardiac catheterization is used to diagnose and/or  treat various heart conditions. Doctors may recommend this procedure for a number of different reasons. The most common reason is to evaluate chest pain. Chest pain can be a symptom of coronary artery disease (CAD), and cardiac catheterization can show whether plaque is narrowing or blocking your hearts arteries. This procedure is also used to evaluate the valves, as well as measure the blood flow and oxygen levels in different parts of your heart. For further information please visit HugeFiesta.[email or web address]. Please follow instruction sheet, as given.     Follow-Up: The structural team will be in touch with next  steps 1}    Other Instructions   [place] OFFICE [place], [place] [address] Dept: [phone] Loc: [phone]  Abrahan Fulmore  04/14/2021  You are scheduled for a Cardiac Catheterization on Thursday, December 29 with Dr. [name].  1. Please arrive at the [facility] (Main Entrance A) at [facility]: [address] [place], [place] [address] at 5:30 AM (This time is two hours before your procedure to ensure your preparation). Free valet parking service is available.   Special note: Every effort is made to have your procedure done on time. Please understand that emergencies sometimes delay scheduled procedures.  2. Diet: Do not eat solid foods after midnight.  The patient may have clear liquids until 5am upon the day of the procedure.  3. Labs: You will have labs drawn today.  4. Medication instructions in preparation for your procedure:   Contrast Allergy: No   On the morning of your procedure, take your Aspirin and any morning medicines NOT listed above.  You may use sips of water.  5. Plan for one night stay--bring personal belongings. 6. Bring a current list of your medications and current insurance cards. 7. You MUST have a responsible person to drive  you home. 8. Someone MUST be with you the first 24 hours after you arrive home or your discharge will be delayed. 9. Please wear clothes that are easy to get on and off and wear slip-on shoes.  Thank you for allowing [place] to care for you!   -- [facility] Invasive Cardiovascular services     Signed, [name], MD  04/14/2021 9:16 AM    [place]

## 2021-04-14 NOTE — H&P (View-Only) (Signed)
Cardiology Office Note:    Date:  04/14/2021   ID:  Rodney Patterson, DOB 07-Nov-1932, MRN [id number]  PCP:  Lavone Orn, MD   [facility] Providers Cardiologist:  None     Referring MD: Lavone Orn, MD   Chief Complaint  Patient presents with   Shortness of Breath     History of Present Illness:    Rodney Patterson is a 87 y.o. male referred by Dr Laurann Montana for evaluation of aortic stenosis.   He is here with his wife today. He has been a healthy, active person. He has always been active with hunting and fishing. He exercises on a treadmill and previously walked for at least 30 minutes, but more recently he has had to reduce the speed of the treadmill and can't walk very long now. He went quail hunting recently and felt very limited in his activities. He states that he 'can't get the oxygen' with activity now. Describes shortness of breath with exertion at any moderate level. Symptoms have been present for about one year now. He denies lightheadedness, syncope, chest pain, or chest pressure.   He has known of a heart murmur for a few years, but recently was referred for an echocardiogram because of his progressive symptoms and heart murmur.   The patient has had regular dental care, partial dentures, no current problems.   Past Medical History:  Diagnosis Date   Bilateral tinnitus    Carotid stenosis, bilateral    per duplex 02-11-2016  bilateral ICA less than50% stenosis   CKD (chronic kidney disease) stage 3, GFR 30-59 ml/min (HCC)    Erectile dysfunction    History of hypertension    per pt pcp took him off medication approx. 2016   Hyperlipidemia    Hyperplasia of prostate with lower urinary tract symptoms (LUTS)    Mild acid reflux    watches diet   Nocturia more than twice per night    Postcholecystectomy diarrhea    Prostate cancer [facility]) urologist-  dr wrenn/ oncologist-  dr Tammi Klippel   dx 06/ 2018-- Stage cT2c, Gleason 3+4, PSA10.1, vol 98cc-- plant external beam  radiation   Seasonal allergic rhinitis    Wears glasses    Wears partial dentures    upper   White coat syndrome without diagnosis of hypertension     Past Surgical History:  Procedure Laterality Date   CATARACT EXTRACTION W/ INTRAOCULAR LENS  IMPLANT, BILATERAL  2016 approx.   COLONOSCOPY  last one 02-17-2004   GOLD SEED IMPLANT N/A 12/27/2016   Procedure: GOLD SEED IMPLANT WITH SPACE OAR;  Surgeon: Irine Seal, MD;  Location: [facility];  Service: Urology;  Laterality: N/A;   LAPAROSCOPIC CHOLECYSTECTOMY  01-18-2003  dr Margot Chimes   PROSTATE BIOPSY  10-26-2016  dr Jeffie Pollock (office)    Current Medications: Current Meds  Medication Sig   AMBULATORY NON FORMULARY MEDICATION Medication Name: Olipure- OTC blood pressure med- Use as needed   amLODipine (NORVASC) 5 MG tablet Take 5 mg by mouth daily.   aspirin 81 MG tablet Take 81 mg by mouth daily.   betamethasone dipropionate 0.05 % cream Apply topically 2 (two) times daily as needed.   Cholecalciferol (VITAMIN D3) 5000 units CAPS Take 1 capsule by mouth every morning.   dorzolamide (TRUSOPT) 2 % ophthalmic solution 1 drop 2 (two) times daily.   loperamide (IMODIUM) 2 MG capsule Take 2 mg by mouth daily as needed for diarrhea or loose stools.   Multiple Vitamins-Minerals (MENS MULTIVITAMIN  PLUS PO) Take 1 capsule by mouth daily.   Omega-3 Fatty Acids (FISH OIL) 1200 MG CAPS Take 1 capsule by mouth every morning.   pravastatin (PRAVACHOL) 20 MG tablet Take 20 mg by mouth daily.   Psyllium (METAMUCIL FIBER PO) Take by mouth as directed.   tadalafil (CIALIS) 20 MG tablet SMARTSIG:1 Tablet(s) By Mouth Every 3 Days PRN   timolol (TIMOPTIC) 0.5 % ophthalmic solution SMARTSIG:In Eye(s)     Allergies:   Adhesive [tape] and Tetanus immune globulin   Social History   Socioeconomic History   Marital status: Married    Spouse name: Not on file   Number of children: Not on file   Years of education: Not on file   Highest  education level: Not on file  Occupational History   Not on file  Tobacco Use   Smoking status: Former    Types: Cigars, Pipe    Quit date: 05/03/1967    Years since quitting: 53.9   Smokeless tobacco: Never  Vaping Use   Vaping Use: Never used  Substance and Sexual Activity   Alcohol use: Yes    Comment: occasional   Drug use: No   Sexual activity: Yes  Other Topics Concern   Not on file  Social History Narrative   Not on file   Social Determinants of Health   Financial Resource Strain: Not on file  Food Insecurity: Not on file  Transportation Needs: Not on file  Physical Activity: Not on file  Stress: Not on file  Social Connections: Not on file     Family History: The patient's family history is negative for Cancer, Esophageal cancer, and Colon cancer.  ROS:   Please see the history of present illness.    Hx prostrate radiation, urinary incontinence. All other systems reviewed and are negative.  EKGs/Labs/Other Studies Reviewed:    The following studies were reviewed today: Echocardiogram: 1. There is severe aortic stenosis with AVA 0.96cm2, mean gradient  61mmHg, peak gradient 13mmHg, Vmax 5.47m/s, DI 0.24 (LVOT VTI 31).   2. Left ventricular ejection fraction, by estimation, is 60 to 65%. The  left ventricle has normal function. The left ventricle has no regional  wall motion abnormalities. There is mild concentric left ventricular  hypertrophy. Left ventricular diastolic  parameters are consistent with Grade I diastolic dysfunction (impaired  relaxation). Elevated left atrial pressure. The average left ventricular  global longitudinal strain is -21.7 %. The global longitudinal strain is  normal.   3. Right ventricular systolic function is normal. The right ventricular  size is normal.   4. Left atrial size was moderately dilated.   5. The mitral valve is abnormal. Trivial mitral valve regurgitation.  Moderate mitral annular calcification.   6. The aortic  valve was not well visualized. There is severe calcifcation  of the aortic valve. There is severe thickening of the aortic valve.  Aortic valve regurgitation is mild. Severe aortic valve stenosis.   7. The inferior vena cava is normal in size with greater than 50%  respiratory variability, suggesting right atrial pressure of 3 mmHg.   Comparison(s): No prior Echocardiogram.   LEFT VENTRICLE  PLAX 2D  LVIDd:         4.70 cm   Diastology  LVIDs:         3.15 cm   LV e' medial:    5.33 cm/s  LV PW:         0.90 cm   LV E/e' medial:  18.5  LV IVS:        1.10 cm   LV e' lateral:   6.75 cm/s  LVOT diam:     2.37 cm   LV E/e' lateral: 14.6  LV SV:         141  LV SV Index:   71        2D Longitudinal Strain  LVOT Area:     4.42 cm  2D Strain GLS (A2C):   -22.1 %                           2D Strain GLS (A3C):   -19.8 %                           2D Strain GLS (A4C):   -23.1 %                           2D Strain GLS Avg:     -21.7 %                              3D Volume EF:                           3D EF:        56 %                           LV EDV:       144 ml                           LV ESV:       64 ml                           LV SV:        80 ml   RIGHT VENTRICLE             IVC  RV Basal diam:  3.20 cm     IVC diam: 1.60 cm  RV S prime:     13.50 cm/s  TAPSE (M-mode): 2.1 cm   LEFT ATRIUM             Index        RIGHT ATRIUM           Index  LA diam:        4.50 cm 2.27 cm/m   RA Pressure: 3.00 mmHg  LA Vol (A2C):   80.3 ml 40.48 ml/m  RA Area:     12.80 cm  LA Vol (A4C):   63.9 ml 32.22 ml/m  RA Volume:   25.80 ml  13.01 ml/m  LA Biplane Vol: 71.2 ml 35.90 ml/m   AORTIC VALVE  AV Area (Vmax):    1.04 cm  AV Area (Vmean):   1.11 cm  AV Area (VTI):     1.09 cm  AV Vmax:           547.00 cm/s  AV Vmean:          358.000 cm/s  AV VTI:            1.290 m  AV Peak Grad:  119.7 mmHg  AV Mean Grad:      69.0 mmHg  LVOT Vmax:         128.33 cm/s  LVOT Vmean:         90.100 cm/s  LVOT VTI:          0.318 m  LVOT/AV VTI ratio: 0.25  AI PHT:            504 msec     AORTA  Ao Root diam: 3.50 cm  Ao Asc diam:  3.60 cm   MITRAL VALVE                TRICUSPID VALVE  MV Area (PHT): 2.03 cm     Estimated RAP:  3.00 mmHg  MV Peak grad:  8.9 mmHg  MV Mean grad:  3.0 mmHg     SHUNTS  MV Vmax:       1.49 m/s     Systemic VTI:  0.32 m  MV Vmean:      74.3 cm/s    Systemic Diam: 2.37 cm  MV Decel Time: 373 msec  MV E velocity: 98.50 cm/s  MV A velocity: 148.00 cm/s  MV E/A ratio:  0.67   EKG:  EKG is ordered today.  The ekg ordered today demonstrates sinus bradycardia 54 bpm, nonspecific T wave abnormality  Recent Labs: No results found for requested labs within last 8760 hours.  Recent Lipid Panel No results found for: CHOL, TRIG, HDL, CHOLHDL, VLDL, LDLCALC, LDLDIRECT   Risk Assessment/Calculations:     STS Risk Calculator: Risk of Mortality: 1.715% Renal Failure: 2.597% Permanent Stroke: 1.425% Prolonged Ventilation: 4.807% DSW Infection: 0.071% Reoperation: 3.856% Morbidity or Mortality: 9.712% Short Length of Stay: 36.210% Long Length of Stay: 4.253%    Physical Exam:    VS:  BP (!) 160/90    Pulse (!) 54    Ht 5' 11.5" (1.816 m)    Wt 182 lb (82.6 kg)    SpO2 98%    BMI 25.03 kg/m     Wt Readings from Last 3 Encounters:  04/14/21 182 lb (82.6 kg)  12/23/19 173 lb 3.2 oz (78.6 kg)  04/04/17 161 lb (73 kg)     GEN:  Well nourished, well developed in no acute distress HEENT: Normal NECK: No JVD; No carotid bruits LYMPHATICS: No lymphadenopathy CARDIAC: RRR, 3/6 harsh late peaking systolic murmur at the RUSB, absent A2 RESPIRATORY:  Clear to auscultation without rales, wheezing or rhonchi  ABDOMEN: Soft, non-tender, non-distended MUSCULOSKELETAL:  No edema; No deformity  SKIN: Warm and dry NEUROLOGIC:  Alert and oriented x 3 PSYCHIATRIC:  Normal affect   ASSESSMENT:    1. Severe aortic stenosis   2.  Nonrheumatic aortic valve stenosis    PLAN:    In order of problems listed above:  The patient has severe, stage D1 aortic stenosis with New York Heart Association functional class II symptoms of chronic diastolic heart failure with exertional dyspnea and fatigue.  The patient's left ventricular ejection fraction is normal at 60 to 65%.  His aortic valve is severely calcified and restricted. He has critical aortic stenosis with a mean transvalvular gradient of 69 mmHg, peak gradient of 120 mmHg, and peak systolic velocity of 5.5 m/s.  I reviewed the natural history of severe aortic stenosis with the patient and his family members today.  We discussed treatment options at length, including TAVR, surgical AVR, and palliative medical therapy.  The patient is active and highly functional.  He clearly warrants treatment.  TAVR will likely be the preferred treatment strategy in the context of his advanced age of 87 years old.  He understands the need for further diagnostic testing to include right and left heart catheterization with coronary angiography and possible PCI. I have reviewed the risks, indications, and alternatives to cardiac catheterization, possible angioplasty, and stenting with the patient. Risks include but are not limited to bleeding, infection, vascular injury, stroke, myocardial infection, arrhythmia, kidney injury, radiation-related injury in the case of prolonged fluoroscopy use, emergency cardiac surgery, and death. The patient understands the risks of serious complication is 1-2 in 0973 with diagnostic cardiac cath and 1-2% or less with angioplasty/stenting.  He will also undergo CT angiography studies to assess anatomic suitability for TAVR and define vascular access options.  Once the studies are completed, he will be referred for formal cardiac surgical consultation as part of a multidisciplinary approach to his care.  As part of his evaluation today, I reviewed the TAVR procedure in  detail with him.  I demonstrated a procedural animation, discussed each step of the procedure, reviewed potential complications, alternatives, and expected postoperative recovery.  Further plans pending his cardiac catheterization and CT angiography results.      Shared Decision Making/Informed Consent The risks [stroke (1 in 1000), death (1 in 1000), kidney failure [usually temporary] (1 in 500), bleeding (1 in 200), allergic reaction [possibly serious] (1 in 200)], benefits (diagnostic support and management of coronary artery disease) and alternatives of a cardiac catheterization were discussed in detail with Mr. Brockman and he is willing to proceed.    Medication Adjustments/Labs and Tests Ordered: Current medicines are reviewed at length with the patient today.  Concerns regarding medicines are outlined above.  Orders Placed This Encounter  Procedures   CT CORONARY MORPH W/CTA COR W/SCORE W/CA W/CM &/OR WO/CM   CT ANGIO ABDOMEN PELVIS  W &/OR WO CONTRAST   CT ANGIO CHEST AORTA W/CM & OR WO/CM   Basic metabolic panel   CBC   EKG 12-Lead   No orders of the defined types were placed in this encounter.   Patient Instructions  Medication Instructions:  Your physician recommends that you continue on your current medications as directed. Please refer to the Current Medication list given to you today.  *If you need a refill on your cardiac medications before your next appointment, please call your pharmacy*   Lab Work: BMET and CBC today  If you have labs (blood work) drawn today and your tests are completely normal, you will receive your results only by: [place] (if you have MyChart) OR A paper copy in the mail If you have any lab test that is abnormal or we need to change your treatment, we will call you to review the results.   Testing/Procedures: Your physician has requested that you have a cardiac catheterization. Cardiac catheterization is used to diagnose and/or  treat various heart conditions. Doctors may recommend this procedure for a number of different reasons. The most common reason is to evaluate chest pain. Chest pain can be a symptom of coronary artery disease (CAD), and cardiac catheterization can show whether plaque is narrowing or blocking your hearts arteries. This procedure is also used to evaluate the valves, as well as measure the blood flow and oxygen levels in different parts of your heart. For further information please visit [email or web address]. Please follow instruction sheet, as given.     Follow-Up: The structural team will be in touch with next  steps 1}    Other Instructions   [facility] [place], [place] [address] Dept: [phone] Loc: [phone]  Percival Glasheen  04/14/2021  You are scheduled for a Cardiac Catheterization on Thursday, December 29 with Dr. [name].  1. Please arrive at the [facility] (Main Entrance A) at [facility]: [address] [place], [place] [address] at 5:30 AM (This time is two hours before your procedure to ensure your preparation). Free valet parking service is available.   Special note: Every effort is made to have your procedure done on time. Please understand that emergencies sometimes delay scheduled procedures.  2. Diet: Do not eat solid foods after midnight.  The patient may have clear liquids until 5am upon the day of the procedure.  3. Labs: You will have labs drawn today.  4. Medication instructions in preparation for your procedure:   Contrast Allergy: No   On the morning of your procedure, take your Aspirin and any morning medicines NOT listed above.  You may use sips of water.  5. Plan for one night stay--bring personal belongings. 6. Bring a current list of your medications and current insurance cards. 7. You MUST have a responsible person to drive  you home. 8. Someone MUST be with you the first 24 hours after you arrive home or your discharge will be delayed. 9. Please wear clothes that are easy to get on and off and wear slip-on shoes.  Thank you for allowing [place] to care for you!   -- [facility] In[facility] Cardiovascular services     Signed, [name], MD  04/14/2021 9:16 AM    [place]

## 2021-04-20 ENCOUNTER — Encounter: Payer: Self-pay | Admitting: Physician Assistant

## 2021-04-22 ENCOUNTER — Encounter (HOSPITAL_COMMUNITY): Payer: Self-pay

## 2021-04-22 ENCOUNTER — Ambulatory Visit (HOSPITAL_COMMUNITY)
Admission: RE | Admit: 2021-04-22 | Discharge: 2021-04-22 | Disposition: A | Discharge status: Home / Self Care | Payer: Medicare Other | Source: Ambulatory Visit | Attending: Cardiovascular Disease | Admitting: Cardiovascular Disease

## 2021-04-22 ENCOUNTER — Other Ambulatory Visit: Payer: Self-pay

## 2021-04-22 DIAGNOSIS — I350 Nonrheumatic aortic (valve) stenosis: Secondary | ICD-10-CM | POA: Diagnosis present

## 2021-04-22 MED ORDER — IOHEXOL 350 MG/ML SOLN
95.0000 mL | Freq: Once | INTRAVENOUS | Status: AC | PRN
  Administered 2021-04-22: 14:00:00 95 mL via INTRAVENOUS

## 2021-04-28 ENCOUNTER — Telehealth: Payer: Self-pay

## 2021-04-28 NOTE — Telephone Encounter (Signed)
Called patient and reviewed the following pre-cath instructions with the patient. Patient has no questions at this time.  Cardiac catheterization scheduled at [facility] for: [facility] Main Entrance A [facility]) at: 0530  Diet-no solid food after midnight prior to cath, clear liquids until 5 AM day of procedure  Medication instructions for procedure:  -Usual morning medications can be taken pre-cath with sips of water including aspirin 81 mg.    Confirmed patient has responsible adult to drive home post procedure and be with patient first 24 hours after arriving home.  [facility] does allow one visitor to accompany you and wait in the hospital waiting room while you are there for your procedure.  You and your visitor will be asked to wear a mask once you enter the hospital.  Patient reports does not currently have any new symptoms concerning for COVID-19 and no household members with COVID-19 like illness.

## 2021-04-29 ENCOUNTER — Encounter (HOSPITAL_COMMUNITY)
Admission: RE | Disposition: A | Discharge status: Home / Self Care | Payer: Self-pay | Source: Home / Self Care | Attending: Cardiovascular Disease

## 2021-04-29 ENCOUNTER — Other Ambulatory Visit: Payer: Self-pay

## 2021-04-29 ENCOUNTER — Ambulatory Visit (HOSPITAL_COMMUNITY)
Admission: RE | Admit: 2021-04-29 | Discharge: 2021-04-29 | Disposition: A | Discharge status: Home / Self Care | Payer: Medicare Other | Attending: Cardiovascular Disease | Admitting: Cardiovascular Disease

## 2021-04-29 ENCOUNTER — Encounter (HOSPITAL_COMMUNITY): Payer: Self-pay | Admitting: Cardiovascular Disease

## 2021-04-29 DIAGNOSIS — N183 Chronic kidney disease, stage 3 unspecified: Secondary | ICD-10-CM | POA: Insufficient documentation

## 2021-04-29 DIAGNOSIS — I350 Nonrheumatic aortic (valve) stenosis: Secondary | ICD-10-CM | POA: Diagnosis present

## 2021-04-29 DIAGNOSIS — I5032 Chronic diastolic (congestive) heart failure: Secondary | ICD-10-CM | POA: Diagnosis not present

## 2021-04-29 DIAGNOSIS — I2582 Chronic total occlusion of coronary artery: Secondary | ICD-10-CM | POA: Diagnosis not present

## 2021-04-29 DIAGNOSIS — I130 Hypertensive heart and chronic kidney disease with heart failure and stage 1 through stage 4 chronic kidney disease, or unspecified chronic kidney disease: Secondary | ICD-10-CM | POA: Diagnosis not present

## 2021-04-29 DIAGNOSIS — R0602 Shortness of breath: Secondary | ICD-10-CM | POA: Insufficient documentation

## 2021-04-29 DIAGNOSIS — I2510 Atherosclerotic heart disease of native coronary artery without angina pectoris: Secondary | ICD-10-CM | POA: Insufficient documentation

## 2021-04-29 HISTORY — PX: RIGHT HEART CATH AND CORONARY ANGIOGRAPHY: CATH118264

## 2021-04-29 LAB — BASIC METABOLIC PANEL
Anion gap: 7 (ref 5–15)
BUN: 18 mg/dL (ref 8–23)
CO2: 24 mmol/L (ref 22–32)
Calcium: 8.9 mg/dL (ref 8.9–10.3)
Chloride: 105 mmol/L (ref 98–111)
Creatinine, Ser: 1.49 mg/dL — ABNORMAL HIGH (ref 0.61–1.24)
GFR, Estimated: 45 mL/min — ABNORMAL LOW (ref >60)
Glucose, Bld: 100 mg/dL — ABNORMAL HIGH (ref 70–99)
Potassium: 4 mmol/L (ref 3.5–5.1)
Sodium: 136 mmol/L (ref 135–145)

## 2021-04-29 LAB — POCT I-STAT EG7
Acid-base deficit: 2 mmol/L (ref 0.0–2.0)
Bicarbonate: 22.2 mmol/L (ref 20.0–28.0)
Calcium, Ion: 1.1 mmol/L — ABNORMAL LOW (ref 1.15–1.40)
HCT: 34 % — ABNORMAL LOW (ref 39.0–52.0)
Hemoglobin: 11.6 g/dL — ABNORMAL LOW (ref 13.0–17.0)
O2 Saturation: 72 %
Potassium: 3.9 mmol/L (ref 3.5–5.1)
Sodium: 142 mmol/L (ref 135–145)
TCO2: 23 mmol/L (ref 22–32)
pCO2, Ven: 36.9 mmHg — ABNORMAL LOW (ref 44.0–60.0)
pH, Ven: 7.387 (ref 7.250–7.430)
pO2, Ven: 38 mmHg (ref 32.0–45.0)

## 2021-04-29 LAB — POCT I-STAT 7, (LYTES, BLD GAS, ICA,H+H)
Acid-base deficit: 1 mmol/L (ref 0.0–2.0)
Bicarbonate: 23.5 mmol/L (ref 20.0–28.0)
Calcium, Ion: 1.24 mmol/L (ref 1.15–1.40)
HCT: 37 % — ABNORMAL LOW (ref 39.0–52.0)
Hemoglobin: 12.6 g/dL — ABNORMAL LOW (ref 13.0–17.0)
O2 Saturation: 93 %
Potassium: 4.3 mmol/L (ref 3.5–5.1)
Sodium: 139 mmol/L (ref 135–145)
TCO2: 25 mmol/L (ref 22–32)
pCO2 arterial: 38.6 mmHg (ref 32.0–48.0)
pH, Arterial: 7.392 (ref 7.350–7.450)
pO2, Arterial: 66 mmHg — ABNORMAL LOW (ref 83.0–108.0)

## 2021-04-29 SURGERY — RIGHT HEART CATH AND CORONARY ANGIOGRAPHY
Anesthesia: LOCAL

## 2021-04-29 MED ORDER — SODIUM CHLORIDE 0.9 % WEIGHT BASED INFUSION: 1.0000 mL/kg/h | INTRAVENOUS | Status: DC

## 2021-04-29 MED ORDER — ACETAMINOPHEN 325 MG PO TABS: 650.0000 mg | ORAL_TABLET | ORAL | Status: DC | PRN

## 2021-04-29 MED ORDER — VERAPAMIL HCL 2.5 MG/ML IV SOLN
INTRAVENOUS | Status: AC
  Filled 2021-04-29: qty 2

## 2021-04-29 MED ORDER — HEPARIN SODIUM (PORCINE) 1000 UNIT/ML IJ SOLN
INTRAMUSCULAR | Status: AC
  Filled 2021-04-29: qty 10

## 2021-04-29 MED ORDER — LIDOCAINE HCL (PF) 1 % IJ SOLN
INTRAMUSCULAR | Status: AC
  Filled 2021-04-29: qty 30

## 2021-04-29 MED ORDER — MIDAZOLAM HCL 2 MG/2ML IJ SOLN
INTRAMUSCULAR | Status: DC | PRN
  Administered 2021-04-29: 1 mg via INTRAVENOUS

## 2021-04-29 MED ORDER — HEPARIN SODIUM (PORCINE) 1000 UNIT/ML IJ SOLN
INTRAMUSCULAR | Status: DC | PRN
  Administered 2021-04-29: 4000 [IU] via INTRAVENOUS

## 2021-04-29 MED ORDER — HEPARIN (PORCINE) IN NACL 1000-0.9 UT/500ML-% IV SOLN
INTRAVENOUS | Status: AC
  Filled 2021-04-29: qty 1000

## 2021-04-29 MED ORDER — ONDANSETRON HCL 4 MG/2ML IJ SOLN: 4.0000 mg | Freq: Four times a day (QID) | INTRAMUSCULAR | Status: DC | PRN

## 2021-04-29 MED ORDER — VERAPAMIL HCL 2.5 MG/ML IV SOLN
INTRAVENOUS | Status: DC | PRN
  Administered 2021-04-29: 08:00:00 10 mL via INTRA_ARTERIAL

## 2021-04-29 MED ORDER — SODIUM CHLORIDE 0.9% FLUSH: 3.0000 mL | INTRAVENOUS | Status: DC | PRN

## 2021-04-29 MED ORDER — ASPIRIN 81 MG PO CHEW: 81.0000 mg | CHEWABLE_TABLET | ORAL | Status: AC

## 2021-04-29 MED ORDER — LIDOCAINE HCL (PF) 1 % IJ SOLN
INTRAMUSCULAR | Status: DC | PRN
  Administered 2021-04-29: 5 mL

## 2021-04-29 MED ORDER — LABETALOL HCL 5 MG/ML IV SOLN: 10.0000 mg | INTRAVENOUS | Status: DC | PRN

## 2021-04-29 MED ORDER — FENTANYL CITRATE (PF) 100 MCG/2ML IJ SOLN
INTRAMUSCULAR | Status: AC
  Filled 2021-04-29: qty 2

## 2021-04-29 MED ORDER — FENTANYL CITRATE (PF) 100 MCG/2ML IJ SOLN
INTRAMUSCULAR | Status: DC | PRN
  Administered 2021-04-29: 25 ug via INTRAVENOUS

## 2021-04-29 MED ORDER — SODIUM CHLORIDE 0.9% FLUSH: 3.0000 mL | Freq: Two times a day (BID) | INTRAVENOUS | Status: DC

## 2021-04-29 MED ORDER — SODIUM CHLORIDE 0.9 % IV SOLN: 250.0000 mL | INTRAVENOUS | Status: DC | PRN

## 2021-04-29 MED ORDER — HYDRALAZINE HCL 20 MG/ML IJ SOLN: 10.0000 mg | INTRAMUSCULAR | Status: DC | PRN

## 2021-04-29 MED ORDER — HEPARIN (PORCINE) IN NACL 1000-0.9 UT/500ML-% IV SOLN
INTRAVENOUS | Status: DC | PRN
  Administered 2021-04-29 (×2): 500 mL

## 2021-04-29 MED ORDER — IOHEXOL 350 MG/ML SOLN
INTRAVENOUS | Status: DC | PRN
  Administered 2021-04-29: 08:00:00 50 mL

## 2021-04-29 MED ORDER — SODIUM CHLORIDE 0.9 % WEIGHT BASED INFUSION
3.0000 mL/kg/h | INTRAVENOUS | Status: AC
  Administered 2021-04-29: 06:00:00 3 mL/kg/h via INTRAVENOUS

## 2021-04-29 MED ORDER — MIDAZOLAM HCL 2 MG/2ML IJ SOLN
INTRAMUSCULAR | Status: AC
  Filled 2021-04-29: qty 2

## 2021-04-29 SURGICAL SUPPLY — 13 items
CATH BALLN WEDGE 5F 110CM (CATHETERS) ×2 IMPLANT
CATH INFINITI 5 FR JL3.5 (CATHETERS) ×2 IMPLANT
CATH INFINITI JR4 5F (CATHETERS) ×2 IMPLANT
DEVICE RAD COMP TR BAND LRG (VASCULAR PRODUCTS) ×2 IMPLANT
GLIDESHEATH SLEND SS 6F .021 (SHEATH) ×2 IMPLANT
GUIDEWIRE .025 260CM (WIRE) ×2 IMPLANT
GUIDEWIRE INQWIRE 1.5J.035X260 (WIRE) IMPLANT
INQWIRE 1.5J .035X260CM (WIRE) ×3
KIT HEART LEFT (KITS) ×3 IMPLANT
PACK CARDIAC CATHETERIZATION (CUSTOM PROCEDURE TRAY) ×3 IMPLANT
SHEATH GLIDE SLENDER 4/5FR (SHEATH) ×2 IMPLANT
TRANSDUCER W/STOPCOCK (MISCELLANEOUS) ×3 IMPLANT
TUBING CIL FLEX 10 FLL-RA (TUBING) ×3 IMPLANT

## 2021-04-29 NOTE — Interval H&P Note (Signed)
History and Physical Interval Note:  04/29/2021 7:37 AM  Rodney Patterson  has presented today for surgery, with the diagnosis of aortic stenosis.  The various methods of treatment have been discussed with the patient and family. After consideration of risks, benefits and other options for treatment, the patient has consented to  Procedure(s): RIGHT/LEFT HEART CATH AND CORONARY ANGIOGRAPHY (N/A) as a surgical intervention.  The patient's history has been reviewed, patient examined, no change in status, stable for surgery.  I have reviewed the patient's chart and labs.  Questions were answered to the patient's satisfaction.     [name]

## 2021-05-05 ENCOUNTER — Other Ambulatory Visit: Payer: Self-pay

## 2021-05-05 ENCOUNTER — Institutional Professional Consult (permissible substitution) (INDEPENDENT_AMBULATORY_CARE_PROVIDER_SITE_OTHER): Payer: Medicare Other | Admitting: Surgery

## 2021-05-05 ENCOUNTER — Encounter: Payer: Self-pay | Admitting: Surgery

## 2021-05-05 VITALS — BP 136/80 | HR 66 | Resp 20 | Ht 71.0 in | Wt 178.0 lb

## 2021-05-05 DIAGNOSIS — I350 Nonrheumatic aortic (valve) stenosis: Secondary | ICD-10-CM

## 2021-05-05 NOTE — Progress Notes (Signed)
Patient ID: ADOLPH CLUTTER, male   DOB: 1933-02-25, 88 y.o.   MRN: [id number]  [facility]INIC        [address]       [place] [address]             [phone]          CARDIOTHORACIC SURGERY CONSULTATION REPORT  PCP is Lavone Orn, MD Referring Provider is [name], MD Primary Cardiologist is [name], MD  Reason for consultation: Critical aortic stenosis  HPI:   The patient has an active 88 year old gentleman with a history of hypertension, hyperlipidemia, prostate cancer status post radiation seed implant, stage III chronic kidney disease, and moderate bilateral carotid stenosis by ultrasound in 2017 who recently underwent an echocardiogram for development of progressive exertional fatigue and shortness of breath.  He said that he has had a heart murmur for several years.  He exercises on a treadmill and used to be able to walk 30 minutes without difficulty but more recently has had to reduce the speed and time that he is walking due to fatigue and shortness of breath.  He went [place] hunting recently in [place] and got very tired trying to walk through the thick brush.  He denies any chest pain or pressure.  He denies dizziness and syncope.  He denies peripheral edema or orthopnea.  An echocardiogram on 04/12/2021 showed critical aortic stenosis with a severely thickened and calcified aortic valve with restricted leaflet mobility.  The mean gradient was 69 mmHg with a peak gradient of 120 mmHg.  Dimensionless index was 0.22 with a valve area of 0.93 cm.  There is mild aortic insufficiency.  Left ventricular ejection fraction was normal at 60 to 65% with mild LVH and grade 1 diastolic dysfunction.  He is here today with his wife.  He stays active hunting and fishing.  He has a remote smoking history.  He sees a dentist regularly and has partial dentures with no problems. Past Medical History:   Diagnosis Date   Bilateral tinnitus    Carotid stenosis, bilateral    per duplex 02-11-2016  bilateral ICA less than50% stenosis   CKD (chronic kidney disease) stage 3, GFR 30-59 ml/min (HCC)    Erectile dysfunction    History of hypertension    per pt pcp took him off medication approx. 2016   Hyperlipidemia    Hyperplasia of prostate with lower urinary tract symptoms (LUTS)    Mild acid reflux    watches diet   Nocturia more than twice per night    Postcholecystectomy diarrhea    Prostate cancer [facility]) urologist-  dr wrenn/ oncologist-  dr Tammi Klippel   dx 06/ 2018-- Stage cT2c, Gleason 3+4, PSA10.1, vol 98cc-- plant external beam radiation   Seasonal allergic rhinitis    Wears glasses    Wears partial dentures    upper   White coat syndrome without diagnosis of hypertension     Past Surgical History:  Procedure Laterality Date   CATARACT EXTRACTION W/ INTRAOCULAR LENS  IMPLANT, BILATERAL  2016 approx.   COLONOSCOPY  last one 02-17-2004   GOLD SEED IMPLANT N/A 12/27/2016   Procedure: GOLD SEED IMPLANT WITH SPACE OAR;  Surgeon: Irine Seal, MD;  Location: [facility];  Service: Urology;  Laterality: N/A;   LAPAROSCOPIC CHOLECYSTECTOMY  01-18-2003  dr Margot Chimes   PROSTATE BIOPSY  10-26-2016  dr Jeffie Pollock (office)   RIGHT HEART CATH  AND CORONARY ANGIOGRAPHY N/A 04/29/2021   Procedure: RIGHT HEART CATH AND CORONARY ANGIOGRAPHY;  Surgeon: [name], MD;  Location: [facility];  Service: Cardiovascular;  Laterality: N/A;    Family History  Problem Relation Age of Onset   Cancer Neg Hx    Esophageal cancer Neg Hx    Colon cancer Neg Hx     Social History   Socioeconomic History   Marital status: Married    Spouse name: Not on file   Number of children: Not on file   Years of education: Not on file   Highest education level: Not on file  Occupational History   Not on file  Tobacco Use   Smoking status: Former    Types: Cigars, Pipe    Quit date:  05/03/1967    Years since quitting: 54.0   Smokeless tobacco: Never  Vaping Use   Vaping Use: Never used  Substance and Sexual Activity   Alcohol use: Yes    Comment: occasional   Drug use: No   Sexual activity: Yes  Other Topics Concern   Not on file  Social History Narrative   Not on file   Social Determinants of Health   Financial Resource Strain: Not on file  Food Insecurity: Not on file  Transportation Needs: Not on file  Physical Activity: Not on file  Stress: Not on file  Social Connections: Not on file  Intimate Partner Violence: Not on file    Prior to Admission medications   Medication Sig Start Date End Date Taking? Authorizing Provider  acetaminophen (TYLENOL) 500 MG tablet Take 500 mg by mouth every 6 (six) hours as needed.   Yes [provider]  amLODipine (NORVASC) 5 MG tablet Take 5 mg by mouth daily. 02/27/21  Yes [provider]  Ascorbic Acid (VITAMIN C) 1000 MG tablet Take 1,000 mg by mouth daily.   Yes [provider]  aspirin 81 MG tablet Take 81 mg by mouth every other day.   Yes [provider]  betamethasone dipropionate 0.05 % cream Apply 1 application topically 2 (two) times daily as needed (itching/rash). 04/01/21  Yes [provider]  Cholecalciferol (VITAMIN D3) 5000 units CAPS Take 5,000 Units by mouth every morning.   Yes [provider]  dorzolamide (TRUSOPT) 2 % ophthalmic solution Place 1 drop into both eyes 2 (two) times daily. 03/10/21  Yes [provider]  loperamide (IMODIUM) 2 MG capsule Take 2 mg by mouth daily as needed for diarrhea or loose stools.   Yes [provider]  methylcellulose (CITRUCEL) oral powder Take 2-3 teaspoonful oral daily   Yes [provider]  Omega-3 1000 MG CAPS Take 1,000 mg by mouth daily.   Yes [provider]  OVER THE COUNTER MEDICATION Take 1 tablet by mouth daily. [employer]   Yes [provider]  pravastatin  (PRAVACHOL) 20 MG tablet Take 20 mg by mouth every other day.   Yes [provider]  Propylene Glycol (SYSTANE COMPLETE) 0.6 % SOLN Place 1 drop into both eyes daily.   Yes [provider]    Current Outpatient Medications  Medication Sig Dispense Refill   acetaminophen (TYLENOL) 500 MG tablet Take 500 mg by mouth every 6 (six) hours as needed.     amLODipine (NORVASC) 5 MG tablet Take 5 mg by mouth daily.     Ascorbic Acid (VITAMIN C) 1000 MG tablet Take 1,000 mg by mouth daily.  aspirin 81 MG tablet Take 81 mg by mouth every other day.     betamethasone dipropionate 0.05 % cream Apply 1 application topically 2 (two) times daily as needed (itching/rash).     Cholecalciferol (VITAMIN D3) 5000 units CAPS Take 5,000 Units by mouth every morning.     dorzolamide (TRUSOPT) 2 % ophthalmic solution Place 1 drop into both eyes 2 (two) times daily.     loperamide (IMODIUM) 2 MG capsule Take 2 mg by mouth daily as needed for diarrhea or loose stools.     methylcellulose (CITRUCEL) oral powder Take 2-3 teaspoonful oral daily     Omega-3 1000 MG CAPS Take 1,000 mg by mouth daily.     OVER THE COUNTER MEDICATION Take 1 tablet by mouth daily. [employer]     pravastatin (PRAVACHOL) 20 MG tablet Take 20 mg by mouth every other day.     Propylene Glycol (SYSTANE COMPLETE) 0.6 % SOLN Place 1 drop into both eyes daily.     No current facility-administered medications for this visit.    Allergies  Allergen Reactions   Honey Bee Venom [Bee Venom] Swelling   Adhesive [Tape] Other (See Comments)    "peels skin off" use paper tape   Rosuvastatin Calcium     legs cramps   Tetanus Immune Globulin Other (See Comments)    Swollen arm      Review of Systems:   General:  normal appetite, + decreased energy, no weight gain, no weight loss, no fever  Cardiac:  no chest pain with exertion, no chest pain at rest, +SOB with moderate exertion, no resting SOB, no PND, no orthopnea, no  palpitations, no arrhythmia, no atrial fibrillation, no LE edema, no dizzy spells, no syncope  Respiratory:  + exertional shortness of breath, no home oxygen, no productive cough, no dry cough, no bronchitis, no wheezing, no hemoptysis, no asthma, no pain with inspiration or cough, no sleep apnea, no CPAP at night  GI:   no difficulty swallowing, no reflux, no frequent heartburn, no hiatal hernia, no abdominal pain, no constipation, + diarrhea, no hematochezia, no hematemesis, no melena  GU:   no dysuria,  + frequency, no urinary tract infection, no hematuria, no enlarged prostate, no kidney stones, no kidney disease  Vascular:  no pain suggestive of claudication, no pain in feet, + leg cramps, no varicose veins, no DVT, no non-healing foot ulcer  Neuro:   no stroke, no TIA's, no seizures, no headaches, no temporary blindness one eye,  no slurred speech, no peripheral neuropathy, no chronic pain, no instability of gait, no memory/cognitive dysfunction  Musculoskeletal: no arthritis, no joint swelling, no myalgias, no difficulty walking, normal mobility   Skin:   no rash, + itching, no skin infections, no pressure sores or ulcerations  Psych:   no anxiety, no depression, no nervousness, no unusual recent stress  Eyes:   no blurry vision, no floaters, no recent vision changes, + wears glasses   ENT:   + hearing loss, no loose or painful teeth, + partial dentures, last saw dentist this year  Hematologic:  no easy bruising, no abnormal bleeding, no clotting disorder, no frequent epistaxis  Endocrine:  no diabetes, does not check CBG's at home     Physical Exam:   BP 136/80    Pulse 66    Resp 20    Ht 5\' 11"  (1.803 m)    Wt 178 lb (80.7 kg)    SpO2 96% Comment: RA  BMI 24.83 kg/m   General:  Thin,  well-appearing  HEENT:  Unremarkable, NCAT, PERLA, EOMI  Neck:   no JVD, no bruits, no adenopathy   Chest:   clear to auscultation, symmetrical breath sounds, no wheezes, no rhonchi   CV:   RRR, 3/6  systolic murmur RSB, no diastolic murmur  Abdomen:  soft, non-tender, no masses   Extremities:  warm, well-perfused, pulses palpable at ankle, no lower extremity edema  Rectal/GU  Deferred  Neuro:   Grossly non-focal and symmetrical throughout  Skin:   Clean and dry, no rashes, no breakdown  Diagnostic Tests:  ECHOCARDIOGRAM REPORT         Patient Name:   TRAI ELLS Date of Exam: 04/12/2021  Medical Rec #:  [id number]     Height:       71.0 in  Accession #:    [id number]    Weight:       173.2 lb  Date of Birth:  05-Jul-1932    BSA:          1.983 m  Patient Age:    56 years      BP:           152/84 mmHg  Patient Gender: M             HR:           64 bpm.  Exam Location:  [address]   Procedure: 2D Echo, 3D Echo, Cardiac Doppler, Color Doppler and Strain  Analysis   Indications:    I35 Aortic stenosis     History:        Patient has no prior history of Echocardiogram  examinations.                  Signs/Symptoms:Shortness of Breath; Risk Factors:Former  Smoker                  and Hypertension. Prostate cancer. Anemia. CKD stage 3.     Sonographer:    Basilia Jumbo BS, RDCS  Referring Phys: [place]     1. There is severe aortic stenosis with AVA 0.96cm2, mean gradient  36mmHg, peak gradient 151mmHg, Vmax 5.9m/s, DI 0.24 (LVOT VTI 31).   2. Left ventricular ejection fraction, by estimation, is 60 to 65%. The  left ventricle has normal function. The left ventricle has no regional  wall motion abnormalities. There is mild concentric left ventricular  hypertrophy. Left ventricular diastolic  parameters are consistent with Grade I diastolic dysfunction (impaired  relaxation). Elevated left atrial pressure. The average left ventricular  global longitudinal strain is -21.7 %. The global longitudinal strain is  normal.   3. Right ventricular systolic function is normal. The right ventricular  size is normal.   4. Left atrial size was moderately  dilated.   5. The mitral valve is abnormal. Trivial mitral valve regurgitation.  Moderate mitral annular calcification.   6. The aortic valve was not well visualized. There is severe calcifcation  of the aortic valve. There is severe thickening of the aortic valve.  Aortic valve regurgitation is mild. Severe aortic valve stenosis.   7. The inferior vena cava is normal in size with greater than 50%  respiratory variability, suggesting right atrial pressure of 3 mmHg.   Comparison(s): No prior Echocardiogram.   FINDINGS   Left Ventricle: Left ventricular ejection fraction, by estimation, is 60  to 65%. The left ventricle has normal function. The left ventricle  has no  regional wall motion abnormalities. The average left ventricular global  longitudinal strain is -21.7 %.  The global longitudinal strain is normal. 3D left ventricular ejection  fraction analysis performed but not reported based on interpreter  judgement due to suboptimal tracking. The left ventricular internal cavity  size was normal in size. There is mild  concentric left ventricular hypertrophy. Left ventricular diastolic  parameters are consistent with Grade I diastolic dysfunction (impaired  relaxation). Elevated left atrial pressure.   Right Ventricle: The right ventricular size is normal. No increase in  right ventricular wall thickness. Right ventricular systolic function is  normal.   Left Atrium: Left atrial size was moderately dilated.   Right Atrium: Right atrial size was normal in size.   Pericardium: There is no evidence of pericardial effusion.   Mitral Valve: The mitral valve is abnormal. There is moderate thickening  of the mitral valve leaflet(s). Moderate mitral annular calcification.  Trivial mitral valve regurgitation. MV peak gradient, 8.9 mmHg. The mean  mitral valve gradient is 3.0 mmHg.   Tricuspid Valve: The tricuspid valve is normal in structure. Tricuspid  valve regurgitation is trivial.    Aortic Valve: Mean gradient 50mmHg, peak 120 mmHg, Vmax 5.5 m/s, DI 0.22,  AVA 0.93. The aortic valve was not well visualized. There is severe  calcifcation of the aortic valve. There is severe thickening of the aortic  valve. Aortic valve regurgitation is  mild. Aortic regurgitation PHT measures 504 msec. Severe aortic stenosis  is present. Aortic valve mean gradient measures 69.0 mmHg. Aortic valve  peak gradient measures 119.7 mmHg. Aortic valve area, by VTI measures 1.09  cm.   Pulmonic Valve: The pulmonic valve was normal in structure. Pulmonic valve  regurgitation is trivial.   Aorta: The aortic root and ascending aorta are structurally normal, with  no evidence of dilitation.   Venous: The inferior vena cava is normal in size with greater than 50%  respiratory variability, suggesting right atrial pressure of 3 mmHg.   IAS/Shunts: No atrial level shunt detected by color flow Doppler.      LEFT VENTRICLE  PLAX 2D  LVIDd:         4.70 cm   Diastology  LVIDs:         3.15 cm   LV e' medial:    5.33 cm/s  LV PW:         0.90 cm   LV E/e' medial:  18.5  LV IVS:        1.10 cm   LV e' lateral:   6.75 cm/s  LVOT diam:     2.37 cm   LV E/e' lateral: 14.6  LV SV:         141  LV SV Index:   71        2D Longitudinal Strain  LVOT Area:     4.42 cm  2D Strain GLS (A2C):   -22.1 %                           2D Strain GLS (A3C):   -19.8 %                           2D Strain GLS (A4C):   -23.1 %  2D Strain GLS Avg:     -21.7 %                              3D Volume EF:                           3D EF:        56 %                           LV EDV:       144 ml                           LV ESV:       64 ml                           LV SV:        80 ml   RIGHT VENTRICLE             IVC  RV Basal diam:  3.20 cm     IVC diam: 1.60 cm  RV S prime:     13.50 cm/s  TAPSE (M-mode): 2.1 cm   LEFT ATRIUM             Index        RIGHT ATRIUM           Index   LA diam:        4.50 cm 2.27 cm/m   RA Pressure: 3.00 mmHg  LA Vol (A2C):   80.3 ml 40.48 ml/m  RA Area:     12.80 cm  LA Vol (A4C):   63.9 ml 32.22 ml/m  RA Volume:   25.80 ml  13.01 ml/m  LA Biplane Vol: 71.2 ml 35.90 ml/m   AORTIC VALVE  AV Area (Vmax):    1.04 cm  AV Area (Vmean):   1.11 cm  AV Area (VTI):     1.09 cm  AV Vmax:           547.00 cm/s  AV Vmean:          358.000 cm/s  AV VTI:            1.290 m  AV Peak Grad:      119.7 mmHg  AV Mean Grad:      69.0 mmHg  LVOT Vmax:         128.33 cm/s  LVOT Vmean:        90.100 cm/s  LVOT VTI:          0.318 m  LVOT/AV VTI ratio: 0.25  AI PHT:            504 msec     AORTA  Ao Root diam: 3.50 cm  Ao Asc diam:  3.60 cm   MITRAL VALVE                TRICUSPID VALVE  MV Area (PHT): 2.03 cm     Estimated RAP:  3.00 mmHg  MV Peak grad:  8.9 mmHg  MV Mean grad:  3.0 mmHg     SHUNTS  MV Vmax:       1.49 m/s     Systemic VTI:  0.32 m  MV Vmean:      74.3  cm/s    Systemic Diam: 2.37 cm  MV Decel Time: 373 msec  MV E velocity: 98.50 cm/s  MV A velocity: 148.00 cm/s  MV E/A ratio:  0.67   Gwyndolyn Kaufman MD  Electronically signed by Gwyndolyn Kaufman MD  Signature Date/Time: 04/12/2021/1:58:40 PM         Final     Physicians  Panel Physicians Referring Physician Case Authorizing Physician  [name], MD (Primary)     Procedures  RIGHT HEART CATH AND CORONARY ANGIOGRAPHY   Conclusion      Prox RCA lesion is 30% stenosed.   Prox LAD lesion is 40% stenosed.   1st Diag lesion is 70% stenosed.   2nd Diag lesion is 90% stenosed.   1st Mrg lesion is 100% stenosed.   There is severe aortic valve stenosis.   Patent left main, LAD, dominant RCA, and large intermediate branches with mild nonobstructive disease Severe diagonal stenosis, small to moderate caliber vessels Chronic occlusion of a small OM1 branch Known critical aortic stenosis with heavy calcification and restriction of the aortic valve on  fluoroscopy Normal right heart pressures   Plan: medical therapy for CAD, continue TAVR evaluation   Indications  Severe aortic stenosis [I35.0 (ICD-10-CM)]   Procedural Details  Technical Details INDICATION: Severe aortic stenosis. Pre-TAVR evaluation.  PROCEDURAL DETAILS: There was an indwelling IV in a right antecubital vein. Using normal sterile technique, the IV was changed out for a 5 Fr brachial sheath over a 0.018 inch wire. The right wrist was then prepped, draped, and anesthetized with 1% lidocaine. Using the modified Seldinger technique a 5/6 French Slender sheath was placed in the right radial artery. Intra-arterial verapamil was administered through the radial artery sheath. IV heparin was administered after a JR4 catheter was advanced into the central aorta. A Swan-Ganz catheter was used for the right heart catheterization. Standard protocol was followed for recording of right heart pressures and sampling of oxygen saturations. Fick cardiac output was calculated. Standard Judkins catheters were used for selective coronary angiography. There were no immediate procedural complications. The patient was transferred to the post catheterization recovery area for further monitoring.      Estimated blood loss <50 mL.   During this procedure medications were administered to achieve and maintain moderate conscious sedation while the patient's heart rate, blood pressure, and oxygen saturation were continuously monitored and I was present face-to-face 100% of this time.   Medications (Filter: Administrations occurring from 0731 to 0809 on 04/29/21)  important  Continuous medications are totaled by the amount administered until 04/29/21 0809.   fentaNYL (SUBLIMAZE) injection (mcg) Total dose:  25 mcg Date/Time Rate/Dose/Volume Action   04/29/21 0742 25 mcg Given    midazolam (VERSED) injection (mg) Total dose:  1 mg Date/Time Rate/Dose/Volume Action   04/29/21 0742 1 mg Given     lidocaine (PF) (XYLOCAINE) 1 % injection (mL) Total volume:  5 mL Date/Time Rate/Dose/Volume Action   04/29/21 0750 5 mL Given    Radial Cocktail/Verapamil only (mL) Total volume:  10 mL Date/Time Rate/Dose/Volume Action   04/29/21 0750 10 mL Given    heparin sodium (porcine) injection (Units) Total dose:  4,000 Units Date/Time Rate/Dose/Volume Action   04/29/21 0757 4,000 Units Given    Heparin (Porcine) in NaCl 1000-0.9 UT/500ML-% SOLN (mL) Total volume:  1,000 mL Date/Time Rate/Dose/Volume Action   04/29/21 0809 500 mL Given   0809 500 mL Given    iohexol (OMNIPAQUE) 350 MG/ML injection (mL) Total volume:  50  mL Date/Time Rate/Dose/Volume Action   04/29/21 0809 50 mL Given    Sedation Time  Sedation Time Physician-1: 22 minutes 11 seconds Contrast  Medication Name Total Dose  iohexol (OMNIPAQUE) 350 MG/ML injection 50 mL   Radiation/Fluoro  Fluoro time: 3.7 (min) DAP: 94496 (mGycm2) Cumulative Air Kerma: 759 (mGy) Complications  Complications documented before study signed (04/29/2021  1:63 AM)   No complications were associated with this study.  Documented by Larose Hires, RN - 04/29/2021  8:09 AM     Coronary Findings  Diagnostic Dominance: Right Left Main  The vessel exhibits minimal luminal irregularities.    Left Anterior Descending  The vessel exhibits minimal luminal irregularities.  Prox LAD lesion is 40% stenosed.    First Diagonal Branch  1st Diag lesion is 70% stenosed.    Second Diagonal Branch  2nd Diag lesion is 90% stenosed.    Ramus Intermedius  Vessel is large. The vessel exhibits minimal luminal irregularities.    Left Circumflex    First Obtuse Marginal Branch  1st Mrg lesion is 100% stenosed. CTO, small vessel    Right Coronary Artery  Vessel is large. The vessel exhibits minimal luminal irregularities.  Prox RCA lesion is 30% stenosed.    Intervention   No interventions have been documented.   Left  Heart  Aortic Valve There is severe aortic valve stenosis. The aortic valve is calcified. There is restricted aortic valve motion.   Coronary Diagrams  Diagnostic Dominance: Right Intervention  Implants     No implant documentation for this case.   Syngo Images   Show images for CARDIAC CATHETERIZATION Images on [employer] Storage   Show images for [place], [place] to Procedure Log  Procedure Log    Hemo Data  Flowsheet Row Most Recent Value  Fick Cardiac Output 6.61 L/min  Fick Cardiac Output Index 3.26 (L/min)/BSA  RA A Wave 9 mmHg  RA V Wave 5 mmHg  RA Mean 3 mmHg  RV Systolic Pressure 33 mmHg  RV Diastolic Pressure -1 mmHg  RV EDP 9 mmHg  PA Systolic Pressure 33 mmHg  PA Diastolic Pressure 11 mmHg  PA Mean 21 mmHg  PW A Wave 17 mmHg  PW V Wave 19 mmHg  PW Mean 14 mmHg  AO Systolic Pressure 846 mmHg  AO Diastolic Pressure 67 mmHg  AO Mean 91 mmHg  QP/QS 1  TPVR Index 6.45 HRUI  TSVR Index 28.56 HRUI  PVR SVR Ratio 0.1  TPVR/TSVR Ratio 0.23   ADDENDUM REPORT: 04/22/2021 19:23   CLINICAL DATA:  24M with severe aortic stenosis being evaluated for a TAVR procedure.   EXAM: Cardiac TAVR CT   TECHNIQUE: The patient was scanned on a [employer]. A 120 kV retrospective scan was triggered in the descending thoracic aorta at 111 HU's. Gantry rotation speed was 250 msecs and collimation was .6 mm. No beta blockade or nitro were given. The 3D data set was reconstructed in 5% intervals of the R-R cycle. Systolic and diastolic phases were analyzed on a dedicated work station using MPR, MIP and VRT modes. The patient received 80 cc of contrast.   FINDINGS: Aortic Root:   Aortic valve: Tricuspid   Aortic valve calcium score: 4635   Aortic annulus:   Diameter: 67mm x 39mm   Perimeter: 35mm   Area: 503 mm^2   Calcifications: Moderate calcification adjacent to noncoronary cusp   Coronary height: Min Left - 80mm, Max Left - 67mm; Min  Right -  60mm   Sinotubular height: Left cusp - 107mm; Right cusp - 28mm; Noncoronary cusp - 54mm   LVOT (as measured 3 mm below the annulus):   Diameter: 64mm x 29mm   Area: 463 mm^2   Calcifications: Calcification inferior to noncoronary cusp   Aortic sinus width: Left cusp - 58mm; Right cusp - 65mm; Noncoronary cusp - 45mm   Sinotubular junction width: 5mm x 31mm   Optimum Fluoroscopic Angle for Delivery: LAO 4 CAU 24   Cardiac:   Right atrium: Mild enlargement   Right ventricle: Mild enlargement   Pulmonary arteries: Normal size   Pulmonary veins: Normal configuration   Left atrium: Mild enlargement   Left ventricle: Normal size   Pericardium: Normal thickness   Coronary arteries: Coronary calcium score 389   IMPRESSION: 1. [place] aortic valve with severe calcifications (AV calcium score 4635)   2. Aortic annulus measures 80mm x 39mm in diameter with perimeter 56mm and area 503 mm^2. Moderate annular calcification adjacent to noncoronary cusp and extending into LVOT. Annular measurements suitable for delivery of 89mm Edwards Sapien 3 valve   3. Sufficient coronary to annulus distance, measuring 66mm to left main and 53mm to RCA   4. Optimum Fluoroscopic Angle for Delivery:  LAO 4 CAU 24   5. Coronary calcium score 389     Electronically Signed   By: Oswaldo Milian M.D.   On: 04/22/2021 19:23    Addended by Donato Heinz, MD on 04/22/2021  7:25 PM   Study Result  Narrative & Impression  EXAM: OVER-READ INTERPRETATION  CT CHEST   The following report is an over-read performed by radiologist Dr. Salvatore Marvel of [facility], [place] on 04/22/2021. This over-read does not include interpretation of cardiac or coronary anatomy or pathology. The coronary CTA interpretation by the cardiologist is attached.   COMPARISON:  None.   FINDINGS: Please see the separate concurrent chest CT angiogram report for details.    IMPRESSION: Please see the separate concurrent chest CT angiogram report for details.   Electronically Signed: By: Ilona Sorrel M.D. On: 04/22/2021 14:29      Narrative & Impression  CLINICAL DATA:  Aortic valve replacement (TAVR), pre-op eval. Severe aortic stenosis.   EXAM: CT ANGIOGRAPHY CHEST, ABDOMEN AND PELVIS   TECHNIQUE: Multidetector CT imaging through the chest, abdomen and pelvis was performed using the standard protocol during bolus administration of intravenous contrast. Multiplanar reconstructed images and MIPs were obtained and reviewed to evaluate the vascular anatomy.   CONTRAST:  35mL OMNIPAQUE IOHEXOL 350 MG/ML SOLN   COMPARISON:  12/06/2016 Fluciclovine PET-CT.   FINDINGS: CTA CHEST FINDINGS   Cardiovascular: Top-normal heart size. Coarse calcification and diffuse thickening of the aortic valve. No significant pericardial effusion/thickening. Left anterior descending and right coronary atherosclerosis. Atherosclerotic nonaneurysmal thoracic aorta. Normal caliber pulmonary arteries. No central pulmonary emboli.   Mediastinum/Nodes: No discrete thyroid nodules. Unremarkable esophagus. No axillary adenopathy. Enlarged 1.0 cm short axis diameter AP window node (series 11/image 56). Enlarged 1.0 cm right hilar node (series 11/image 60). Enlarged 1.1 cm left hilar node (series 11/image 70). These mildly enlarged thoracic nodes are unchanged from 12/06/2016 PET-CT.   Lungs/Pleura: No pneumothorax. No pleural effusion. No acute consolidative airspace disease or lung masses. Solid posterior left lower lobe pulmonary nodules, largest 5 mm (series 12/image 77), for which comparison to 12/06/2016 PET-CT cannot be made due to hypoventilation on prior PET-CT. Moderate patchy subpleural reticulation and ground-glass opacity throughout both lungs with a mild basilar predominance.  Musculoskeletal:  No aggressive appearing focal osseous lesions.   CTA  ABDOMEN AND PELVIS FINDINGS   Hepatobiliary: Normal liver with no liver mass. Cholecystectomy. No biliary ductal dilatation.   Pancreas: Normal, with no mass or duct dilation.   Spleen: Normal size spleen. Hypodense 1.5 cm central splenic lesion (series 11/image 111) appears stable since 12/06/2016 head CT, considered benign.   Adrenals/Urinary Tract: Normal adrenals. No contour deforming renal masses. No hydronephrosis. Chronic diffuse bladder wall thickening. Bladder is nondistended.   Stomach/Bowel: Normal non-distended stomach. Normal caliber small bowel with no small bowel wall thickening. Normal appendix. Marked diffuse colonic diverticulosis, most prominent in the sigmoid colon, with no large bowel wall thickening or significant pericolonic fat stranding.   Vascular/Lymphatic: Atherosclerotic nonaneurysmal abdominal aorta. No pathologically enlarged lymph nodes in the abdomen or pelvis.   Reproductive: Moderate to marked prostatomegaly. Fiducial markers scattered in the prostate.   Other: No pneumoperitoneum, ascites or focal fluid collection.   Musculoskeletal: No aggressive appearing focal osseous lesions.   VASCULAR MEASUREMENTS PERTINENT TO TAVR:   AORTA:   Minimal Aortic Diameter-16.5 x 16.2 mm   Severity of Aortic Calcification-moderate to severe   RIGHT PELVIS:   Right Common Iliac Artery -   Minimal Diameter-11.5 x 9.8 mm   Tortuosity-mild   Calcification-moderate   Right External Iliac Artery -   Minimal Diameter-8.8 x 8.4 mm   Tortuosity-moderate   Calcification-mild   Right Common Femoral Artery -   Minimal Diameter-9.2 x 8.6 mm   Tortuosity-mild   Calcification-moderate   LEFT PELVIS:   Left Common Iliac Artery -   Minimal Diameter-11.4 x 9.4 mm   Tortuosity-mild   Calcification-mild   Left External Iliac Artery -   Minimal Diameter-9.9 x 9.4 mm   Tortuosity-mild-to-moderate   Calcification-mild   Left Common  Femoral Artery -   Minimal Diameter-10.0 x 9.2 mm   Tortuosity-mild   Calcification-moderate   Review of the MIP images confirms the above findings.   IMPRESSION: 1. Vascular findings and measurements pertinent to potential TAVR procedure, as detailed. 2. Diffuse thickening and coarse calcification of the aortic valve, compatible with the reported clinical history of severe aortic stenosis. 3. Two-vessel coronary atherosclerosis. 4. Moderate patchy subpleural reticulation and ground-glass opacity throughout both lungs with a mild basilar predominance, nonspecific, suggestive of fibrotic interstitial lung disease, with UIP not excluded. Pulmonology consultation and high-resolution chest CT evaluation may be obtained as clinically warranted. 5. Solid posterior left lower lobe pulmonary nodules, largest 5 mm. Follow-up chest CT suggested in 3-6 months given history of prostate cancer. 6. Mild mediastinal and bilateral hilar lymphadenopathy is stable since 12/06/2016 PET-CT, nonspecific, favor reactive. 7. Marked diffuse colonic diverticulosis. 8. Chronic diffuse bladder wall thickening, probably due to chronic bladder outlet obstruction by the enlarged prostate. 9. Aortic Atherosclerosis (ICD10-I70.0).     Electronically Signed   By: Ilona Sorrel M.D.   On: 04/22/2021 14:56    STS Risk Calculator: Risk of Mortality: 1.715% Renal Failure: 2.597% Permanent Stroke: 1.425% Prolonged Ventilation: 4.807% DSW Infection: 0.071% Reoperation: 3.856% Morbidity or Mortality: 9.712% Short Length of Stay: 36.210% Long Length of Stay: 4.253%  Impression:  This 88 year old gentleman has stage D, critical, symptomatic aortic stenosis with New York Heart Association class II symptoms of exertional fatigue and shortness of breath consistent with chronic diastolic congestive heart failure.  I have personally reviewed his 2D echocardiogram, cardiac catheterization, and CTA  studies.  His echo shows a severely calcified and thickened aortic valve with a mean  gradient of 69 mmHg consistent with critical aortic stenosis.  Left ventricular ejection fraction is normal.  Cardiac catheterization shows three-vessel coronary disease with chronic occlusion of a small first marginal branch, severe stenosis of a small to moderate size diagonal branch, and mild nonobstructive RCA and LAD stenosis.  This can be managed medically.  I agree that aortic valve replacement is indicated in this patient for relief of his symptoms and to prevent progressive left ventricular deterioration.  Given his age I think transcatheter aortic valve replacement would be the best treatment.  His gated cardiac CTA shows anatomy suitable for TAVR using a SAPIEN 3 valve.  His abdominal and pelvic CTA shows adequate pelvic vascular anatomy to allow transfemoral insertion.  The patient and his wife were counseled at length regarding treatment alternatives for management of severe symptomatic aortic stenosis. The risks and benefits of surgical intervention has been discussed in detail. Long-term prognosis with medical therapy was discussed. Alternative approaches such as conventional surgical aortic valve replacement, transcatheter aortic valve replacement, and palliative medical therapy were compared and contrasted at length. This discussion was placed in the context of the patient's own specific clinical presentation and past medical history. All of their questions have been addressed.   Following the decision to proceed with transcatheter aortic valve replacement, a discussion was held regarding what types of management strategies would be attempted intraoperatively in the event of life-threatening complications, including whether or not the patient would be considered a candidate for the use of cardiopulmonary bypass and/or conversion to open sternotomy for attempted surgical intervention.  Despite his advanced age he  is in good overall condition and I think he would be a candidate for emergent sternotomy to manage any intraoperative complications.  The patient is aware of the fact that transient use of cardiopulmonary bypass may be necessary. The patient has been advised of a variety of complications that might develop including but not limited to risks of death, stroke, paravalvular leak, aortic dissection or other major vascular complications, aortic annulus rupture, device embolization, cardiac rupture or perforation, mitral regurgitation, acute myocardial infarction, arrhythmia, heart block or bradycardia requiring permanent pacemaker placement, congestive heart failure, respiratory failure, renal failure, pneumonia, infection, other late complications related to structural valve deterioration or migration, or other complications that might ultimately cause a temporary or permanent loss of functional independence or other long term morbidity. The patient provides full informed consent for the procedure as described and all questions were answered.      Plan:  He will be scheduled for transfemoral TAVR using a SAPIEN 3 valve on Tuesday, 05/11/2021.  I spent 60 minutes performing this consultation and > 50% of this time was spent face to face counseling and coordinating the care of this patient's critical symptomatic aortic stenosis.   Gaye Pollack, MD 05/05/2021

## 2021-05-06 NOTE — Pre-Procedure Instructions (Signed)
Surgical Instructions    Your procedure is scheduled on Tuesday 05/11/21.   Report to [facility] Main Entrance "A" at 05:30 A.M., then check in with the Admitting office.  Call this number if you have problems the morning of surgery:  [phone]   If you have any questions prior to your surgery date call [phone]: Open Monday-Friday 8am-4pm    Remember:   Do not eat or drink after midnight the night before your surgery  Per your surgeon's instructions STOP now taking any Aleve, Naproxen, Ibuprofen, Motrin, Advil, Goody's, BC's, all herbal medications, fish oil, and all vitamins. Continue taking all other medications including Aspirin without change through the day before surgery. On the morning of surgery do not take any medications   Do NOT Smoke (Tobacco/Vaping) or drink Alcohol 24 hours prior to your procedure.  If you use a CPAP at night, you may bring all equipment for your overnight stay.   Contacts, glasses, piercing's, hearing aid's, dentures or partials may not be worn into surgery, please bring cases for these belongings.    For patients admitted to the hospital, discharge time will be determined by your treatment team.   Patients discharged the day of surgery will not be allowed to drive home, and someone needs to stay with them for 24 hours.  NO VISITORS WILL BE ALLOWED IN PRE-OP WHERE PATIENTS GET READY FOR SURGERY.  ONLY 1 SUPPORT PERSON MAY BE PRESENT IN THE WAITING ROOM WHILE YOU ARE IN SURGERY.  IF YOU ARE TO BE ADMITTED, ONCE YOU ARE IN YOUR ROOM YOU WILL BE ALLOWED TWO (2) VISITORS.  Minor children may have two parents present. Special consideration for safety and communication needs will be reviewed on a case by case basis.   Special instructions:   [place]- Preparing For Surgery  Before surgery, you can play an important role. Because skin is not sterile, your skin needs to be as free of germs as possible. You can reduce the number of germs on your  skin by washing with CHG (chlorahexidine gluconate) Soap before surgery.  CHG is an antiseptic cleaner which kills germs and bonds with the skin to continue killing germs even after washing.    Oral Hygiene is also important to reduce your risk of infection.  Remember - BRUSH YOUR TEETH THE MORNING OF SURGERY WITH YOUR REGULAR TOOTHPASTE  Please do not use if you have an allergy to CHG or antibacterial soaps. If your skin becomes reddened/irritated stop using the CHG.  Do not shave (including legs and underarms) for at least 48 hours prior to first CHG shower. It is OK to shave your face.  Please follow these instructions carefully.   Shower the NIGHT BEFORE SURGERY and the MORNING OF SURGERY  If you chose to wash your hair, wash your hair first as usual with your normal shampoo.  After you shampoo, rinse your hair and body thoroughly to remove the shampoo.  Use CHG Soap as you would any other liquid soap. You can apply CHG directly to the skin and wash gently with a scrungie or a clean washcloth.   Apply the CHG Soap to your body ONLY FROM THE NECK DOWN.  Do not use on open wounds or open sores. Avoid contact with your eyes, ears, mouth and genitals (private parts). Wash Face and genitals (private parts)  with your normal soap.   Wash thoroughly, paying special attention to the area where your surgery will be performed.  Thoroughly rinse your body  with warm water from the neck down.  DO NOT shower/wash with your normal soap after using and rinsing off the CHG Soap.  Pat yourself dry with a CLEAN TOWEL.  Wear CLEAN PAJAMAS to bed the night before surgery  Place CLEAN SHEETS on your bed the night before your surgery  DO NOT SLEEP WITH PETS.   Day of Surgery: Shower with CHG soap. Do not wear jewelry, make up, nail polish, gel polish, artificial nails, or any other type of covering on natural nails including finger and toenails. If patients have artificial nails, gel coating, etc.  that need to be removed by a nail salon please have this removed prior to surgery. Surgery may need to be canceled/delayed if the surgeon/ anesthesia feels like the patient is unable to be adequately monitored. Do not wear lotions, powders, perfumes/colognes, or deodorant. Do not shave 48 hours prior to surgery.  Men may shave face and neck. Do not bring valuables to the hospital. [facility] is not responsible for any belongings or valuables. Wear Clean/Comfortable clothing the morning of surgery Remember to brush your teeth WITH YOUR REGULAR TOOTHPASTE.   Please read over the following fact sheets that you were given.   3 days prior to your procedure or After your COVID test   You are not required to quarantine however you are required to wear a well-fitting mask when you are out and around people not in your household. If your mask becomes wet or soiled, replace with a new one.   Wash your hands often with soap and water for 20 seconds or clean your hands with an alcohol-based hand sanitizer that contains at least 60% alcohol.   Do not share personal items.   Notify your provider:  o if you are in close contact with someone who has COVID  o or if you develop a fever of 100.4 or greater, sneezing, cough, sore throat, shortness of breath or body aches.

## 2021-05-07 ENCOUNTER — Encounter (HOSPITAL_COMMUNITY)
Admission: RE | Admit: 2021-05-07 | Discharge: 2021-05-07 | Disposition: A | Discharge status: Home / Self Care | Payer: Medicare Other | Source: Ambulatory Visit | Attending: Cardiovascular Disease | Admitting: Cardiovascular Disease

## 2021-05-07 ENCOUNTER — Other Ambulatory Visit: Payer: Self-pay

## 2021-05-07 ENCOUNTER — Encounter (HOSPITAL_COMMUNITY): Payer: Self-pay

## 2021-05-07 ENCOUNTER — Ambulatory Visit (HOSPITAL_COMMUNITY)
Admission: RE | Admit: 2021-05-07 | Discharge: 2021-05-07 | Disposition: A | Discharge status: Home / Self Care | Payer: Medicare Other | Source: Ambulatory Visit | Attending: Cardiovascular Disease | Admitting: Cardiovascular Disease

## 2021-05-07 VITALS — BP 145/76 | HR 57 | Temp 98.0°F | Resp 18 | Ht 70.0 in | Wt 180.4 lb

## 2021-05-07 DIAGNOSIS — I350 Nonrheumatic aortic (valve) stenosis: Secondary | ICD-10-CM

## 2021-05-07 DIAGNOSIS — N401 Enlarged prostate with lower urinary tract symptoms: Secondary | ICD-10-CM | POA: Insufficient documentation

## 2021-05-07 DIAGNOSIS — Z20822 Contact with and (suspected) exposure to covid-19: Secondary | ICD-10-CM | POA: Insufficient documentation

## 2021-05-07 DIAGNOSIS — I1310 Hypertensive heart and chronic kidney disease without heart failure, with stage 1 through stage 4 chronic kidney disease, or unspecified chronic kidney disease: Secondary | ICD-10-CM | POA: Diagnosis not present

## 2021-05-07 DIAGNOSIS — Z8546 Personal history of malignant neoplasm of prostate: Secondary | ICD-10-CM | POA: Diagnosis not present

## 2021-05-07 DIAGNOSIS — Z01818 Encounter for other preprocedural examination: Secondary | ICD-10-CM | POA: Insufficient documentation

## 2021-05-07 DIAGNOSIS — I080 Rheumatic disorders of both mitral and aortic valves: Secondary | ICD-10-CM | POA: Diagnosis not present

## 2021-05-07 DIAGNOSIS — R0600 Dyspnea, unspecified: Secondary | ICD-10-CM | POA: Insufficient documentation

## 2021-05-07 DIAGNOSIS — I2510 Atherosclerotic heart disease of native coronary artery without angina pectoris: Secondary | ICD-10-CM | POA: Insufficient documentation

## 2021-05-07 DIAGNOSIS — I700 Atherosclerosis of aorta: Secondary | ICD-10-CM | POA: Insufficient documentation

## 2021-05-07 DIAGNOSIS — N183 Chronic kidney disease, stage 3 unspecified: Secondary | ICD-10-CM | POA: Insufficient documentation

## 2021-05-07 DIAGNOSIS — N138 Other obstructive and reflux uropathy: Secondary | ICD-10-CM | POA: Diagnosis not present

## 2021-05-07 DIAGNOSIS — R918 Other nonspecific abnormal finding of lung field: Secondary | ICD-10-CM | POA: Insufficient documentation

## 2021-05-07 DIAGNOSIS — E785 Hyperlipidemia, unspecified: Secondary | ICD-10-CM | POA: Insufficient documentation

## 2021-05-07 DIAGNOSIS — K5730 Diverticulosis of large intestine without perforation or abscess without bleeding: Secondary | ICD-10-CM | POA: Insufficient documentation

## 2021-05-07 HISTORY — DX: Pneumonia, unspecified organism: J18.9

## 2021-05-07 HISTORY — DX: Other complications of anesthesia, initial encounter: T88.59XA

## 2021-05-07 HISTORY — DX: Dyspnea, unspecified: R06.00

## 2021-05-07 HISTORY — DX: Atherosclerotic heart disease of native coronary artery without angina pectoris: I25.10

## 2021-05-07 HISTORY — DX: Cardiac murmur, unspecified: R01.1

## 2021-05-07 LAB — COMPREHENSIVE METABOLIC PANEL
ALT: 19 U/L (ref 0–44)
AST: 25 U/L (ref 15–41)
Albumin: 3.8 g/dL (ref 3.5–5.0)
Alkaline Phosphatase: 63 U/L (ref 38–126)
Anion gap: 8 (ref 5–15)
BUN: 18 mg/dL (ref 8–23)
CO2: 19 mmol/L — ABNORMAL LOW (ref 22–32)
Calcium: 8.9 mg/dL (ref 8.9–10.3)
Chloride: 107 mmol/L (ref 98–111)
Creatinine, Ser: 1.38 mg/dL — ABNORMAL HIGH (ref 0.61–1.24)
GFR, Estimated: 50 mL/min — ABNORMAL LOW (ref >60)
Glucose, Bld: 93 mg/dL (ref 70–99)
Potassium: 4.2 mmol/L (ref 3.5–5.1)
Sodium: 134 mmol/L — ABNORMAL LOW (ref 135–145)
Total Bilirubin: 0.8 mg/dL (ref 0.3–1.2)
Total Protein: 7.3 g/dL (ref 6.5–8.1)

## 2021-05-07 LAB — BLOOD GAS, ARTERIAL
Acid-base deficit: 3 mmol/L — ABNORMAL HIGH (ref 0.0–2.0)
Bicarbonate: 20.7 mmol/L (ref 20.0–28.0)
Drawn by: 58793
FIO2: 21
O2 Saturation: 98.7 %
Patient temperature: 37
pCO2 arterial: 32 mmHg (ref 32.0–48.0)
pH, Arterial: 7.426 (ref 7.350–7.450)
pO2, Arterial: 115 mmHg — ABNORMAL HIGH (ref 83.0–108.0)

## 2021-05-07 LAB — BRAIN NATRIURETIC PEPTIDE: B Natriuretic Peptide: 78.2 pg/mL (ref 0.0–100.0)

## 2021-05-07 LAB — CBC
HCT: 41 % (ref 39.0–52.0)
Hemoglobin: 13.7 g/dL (ref 13.0–17.0)
MCH: 32.9 pg (ref 26.0–34.0)
MCHC: 33.4 g/dL (ref 30.0–36.0)
MCV: 98.6 fL (ref 80.0–100.0)
Platelets: 221 10*3/uL (ref 150–400)
RBC: 4.16 MIL/uL — ABNORMAL LOW (ref 4.22–5.81)
RDW: 12 % (ref 11.5–15.5)
WBC: 6.5 10*3/uL (ref 4.0–10.5)
nRBC: 0 % (ref 0.0–0.2)

## 2021-05-07 LAB — SARS CORONAVIRUS 2 (TAT 6-24 HRS): SARS Coronavirus 2: NEGATIVE

## 2021-05-07 LAB — URINALYSIS, ROUTINE W REFLEX MICROSCOPIC
Bilirubin Urine: NEGATIVE
Glucose, UA: NEGATIVE mg/dL
Hgb urine dipstick: NEGATIVE
Ketones, ur: NEGATIVE mg/dL
Leukocytes,Ua: NEGATIVE
Nitrite: NEGATIVE
Protein, ur: NEGATIVE mg/dL
Specific Gravity, Urine: 1.02 (ref 1.005–1.030)
pH: 5.5 (ref 5.0–8.0)

## 2021-05-07 LAB — TYPE AND SCREEN
ABO/RH(D): A POS
Antibody Screen: NEGATIVE

## 2021-05-07 LAB — SURGICAL PCR SCREEN
MRSA, PCR: POSITIVE — AB
Staphylococcus aureus: POSITIVE — AB

## 2021-05-07 LAB — PROTIME-INR
INR: 1 (ref 0.8–1.2)
Prothrombin Time: 13.4 seconds (ref 11.4–15.2)

## 2021-05-07 NOTE — Progress Notes (Signed)
PCP - Dr. Lavone Orn Cardiologist - Dr. [name]  PPM/ICD - n/a    Chest x-ray - 05/07/21- Within two weeks EKG - 04/14/21- Within one month Stress Test - denies ECHO - 04/12/21 Cardiac Cath - 04/29/21  Sleep Study - denies CPAP - n/a  Diabetes- Patient denies  Aspirin/ Blood Thinner Instructions: Per instructions from the office. STOP now taking any Aleve, Naproxen, Ibuprofen, Motrin, Advil, Goody's, BC's, all herbal medications, fish oil, and all vitamins. Continue taking all other medications including Aspirin without change through the day before surgery. On the morning of surgery do not take any medications.,     ERAS Protcol - NO. NPO  COVID TEST- 05/07/21. Pending.    Anesthesia review: Yes  Patient denies shortness of breath, fever, cough and chest pain at PAT appointment   All instructions explained to the patient, with a verbal understanding of the material. Patient agrees to go over the instructions while at home for a better understanding. Patient also instructed to self quarantine after being tested for COVID-19. The opportunity to ask questions was provided.

## 2021-05-07 NOTE — Progress Notes (Signed)
Theodosia Quay, RN with Dr. Burt Knack and Angelena Form, PA made aware patient's PCR resulted positive for staph and MRSA.

## 2021-05-10 ENCOUNTER — Encounter (HOSPITAL_COMMUNITY): Payer: Self-pay

## 2021-05-10 MED ORDER — MAGNESIUM SULFATE 50 % IJ SOLN
40.0000 meq | INTRAMUSCULAR | Status: DC
  Filled 2021-05-10: qty 9.85

## 2021-05-10 MED ORDER — NOREPINEPHRINE 4 MG/250ML-% IV SOLN
0.0000 ug/min | INTRAVENOUS | Status: AC
  Administered 2021-05-11: 1 ug/min via INTRAVENOUS
  Filled 2021-05-10: qty 250

## 2021-05-10 MED ORDER — HEPARIN 30,000 UNITS/1000 ML (OHS) CELLSAVER SOLUTION
Status: DC
  Filled 2021-05-10: qty 1000

## 2021-05-10 MED ORDER — POTASSIUM CHLORIDE 2 MEQ/ML IV SOLN
80.0000 meq | INTRAVENOUS | Status: DC
  Filled 2021-05-10: qty 40

## 2021-05-10 MED ORDER — DEXMEDETOMIDINE HCL IN NACL 400 MCG/100ML IV SOLN
0.1000 ug/kg/h | INTRAVENOUS | Status: AC
  Administered 2021-05-11: 1 ug/kg/h via INTRAVENOUS
  Administered 2021-05-11: 81.8 ug via INTRAVENOUS
  Filled 2021-05-10: qty 100

## 2021-05-10 MED ORDER — CEFAZOLIN SODIUM-DEXTROSE 2-4 GM/100ML-% IV SOLN
2.0000 g | INTRAVENOUS | Status: DC
  Filled 2021-05-10: qty 100

## 2021-05-10 NOTE — Progress Notes (Signed)
Anesthesia Chart Review:  Case: [id number] Date/Time: 05/11/21 0715   Procedures:      TRANSCATHETER AORTIC VALVE REPLACEMENT, TRANSFEMORAL (Chest)     INTRAOPERATIVE TRANSTHORACIC ECHOCARDIOGRAM   Anesthesia type: Monitor Anesthesia Care   Pre-op diagnosis: Critical Aortic Stenosis   Location: [facility] ROOM 61 / [facility]   Surgeons: [name], MD     CT Surgeon: [name], MD  DISCUSSION: Patient is an 88 year old male scheduled for the above procedure.  History includes former smoker (quit 05/03/67), murmur/severe AS, CAD (medical therapy for CAD 04/29/21), HTN, HLD, CKD, dyspnea, prostate cancer (gold seed implant 12/27/16), carotid artery disease (< 50% 2017 [place]), reflux.  05/07/2021 presurgical COVID-19 test negative.  Anesthesia team to evaluate on the day of surgery.   VS: BP (!) 145/76    Pulse (!) 57    Temp 36.7 C (Oral)    Resp 18    Ht 5\' 10"  (1.778 m)    Wt 81.8 kg    SpO2 99%    BMI 25.88 kg/m    PROVIDERS: Lavone Orn, MD is PCP [name], MD is cardiologist   LABS: Labs reviewed: Acceptable for surgery. (all labs ordered are listed, but only abnormal results are displayed)  Labs Reviewed  SURGICAL PCR SCREEN - Abnormal; Notable for the following components:      Result Value   MRSA, PCR POSITIVE (*)    Staphylococcus aureus POSITIVE (*)    All other components within normal limits  CBC - Abnormal; Notable for the following components:   RBC 4.16 (*)    All other components within normal limits  COMPREHENSIVE METABOLIC PANEL - Abnormal; Notable for the following components:   Sodium 134 (*)    CO2 19 (*)    Creatinine, Ser 1.38 (*)    GFR, Estimated 50 (*)    All other components within normal limits  BLOOD GAS, ARTERIAL - Abnormal; Notable for the following components:   pO2, Arterial 115 (*)    Acid-base deficit 3.0 (*)    All other components within normal limits  SARS CORONAVIRUS 2 (TAT 6-24 HRS)  PROTIME-INR  URINALYSIS, ROUTINE W REFLEX  MICROSCOPIC  BRAIN NATRIURETIC PEPTIDE  TYPE AND SCREEN     IMAGES: CXR 05/07/21: FINDINGS: - Lungs are clear. The small left lower lobe and right middle lobe nodules seen on CT are less conspicuous. - Heart size and mediastinal contours are within normal limits. Aortic Atherosclerosis (ICD10-170.0). - No effusion. - Visualized bones unremarkable. IMPRESSION: No acute cardiopulmonary disease.  CTA chest/abd/pelvis 04/22/21: IMPRESSION: 1. Vascular findings and measurements pertinent to potential TAVR procedure, as detailed. 2. Diffuse thickening and coarse calcification of the aortic valve, compatible with the reported clinical history of severe aortic stenosis. 3. Two-vessel coronary atherosclerosis. 4. Moderate patchy subpleural reticulation and ground-glass opacity throughout both lungs with a mild basilar predominance, nonspecific, suggestive of fibrotic interstitial lung disease, with UIP not excluded. Pulmonology consultation and high-resolution chest CT evaluation may be obtained as clinically warranted. 5. Solid posterior left lower lobe pulmonary nodules, largest 5 mm. Follow-up chest CT suggested in 3-6 months given history of prostate cancer. 6. Mild mediastinal and bilateral hilar lymphadenopathy is stable since 12/06/2016 PET-CT, nonspecific, favor reactive. 7. Marked diffuse colonic diverticulosis. 8. Chronic diffuse bladder wall thickening, probably due to chronic bladder outlet obstruction by the enlarged prostate. 9. Aortic Atherosclerosis (ICD10-I70.0).    EKG: 04/14/21: SB at 54 bpm   CV: Cardiac cath 04/29/21:   Prox RCA lesion is 30%  stenosed.   Prox LAD lesion is 40% stenosed.   1st Diag lesion is 70% stenosed.   2nd Diag lesion is 90% stenosed.   1st Mrg lesion is 100% stenosed.   There is severe aortic valve stenosis.   Patent left main, LAD, dominant RCA, and large intermediate branches with mild nonobstructive disease Severe diagonal  stenosis, small to moderate caliber vessels Chronic occlusion of a small OM1 branch Known critical aortic stenosis with heavy calcification and restriction of the aortic valve on fluoroscopy Normal right heart pressures Plan: medical therapy for CAD, continue TAVR evaluation   CT Coronary 04/22/21: IMPRESSION: 1. Trileafet aortic valve with severe calcifications (AV calcium score 4635) 2. Aortic annulus measures 56mm x 31mm in diameter with perimeter 92mm and area 503 mm^2. Moderate annular calcification adjacent to noncoronary cusp and extending into LVOT. Annular measurements suitable for delivery of 31mm Edwards Sapien 3 valve 3. Sufficient coronary to annulus distance, measuring 9mm to left main and 36mm to RCA 4. Optimum Fluoroscopic Angle for Delivery:  LAO 4 CAU 24 5. Coronary calcium score 389    Echo 04/12/21: IMPRESSIONS   1. There is severe aortic stenosis with AVA 0.96cm2, mean gradient  30mmHg, peak gradient 122mmHg, Vmax 5.69m/s, DI 0.24 (LVOT VTI 31).   2. Left ventricular ejection fraction, by estimation, is 60 to 65%. The  left ventricle has normal function. The left ventricle has no regional  wall motion abnormalities. There is mild concentric left ventricular  hypertrophy. Left ventricular diastolic  parameters are consistent with Grade I diastolic dysfunction (impaired  relaxation). Elevated left atrial pressure. The average left ventricular  global longitudinal strain is -21.7 %. The global longitudinal strain is  normal.   3. Right ventricular systolic function is normal. The right ventricular  size is normal.   4. Left atrial size was moderately dilated.   5. The mitral valve is abnormal. Trivial mitral valve regurgitation.  Moderate mitral annular calcification.   6. The aortic valve was not well visualized. There is severe calcifcation  of the aortic valve. There is severe thickening of the aortic valve.  Aortic valve regurgitation is mild. Severe  aortic valve stenosis.   7. The inferior vena cava is normal in size with greater than 50%  respiratory variability, suggesting right atrial pressure of 3 mmHg.  - Comparison(s): No prior Echocardiogram.    US Carotid 02/11/16: IMPRESSION: Moderate partially calcified plaque formation is noted in the right carotid bulb and proximal right internal carotid artery consistent with less than 50% diameter stenosis based on ultrasound and Doppler criteria.   Mild eccentric plaque formation is noted in the left carotid bulb and proximal left internal carotid artery consistent with less than 50% diameter stenosis based on ultrasound and Doppler criteria.    Past Medical History:  Diagnosis Date   Bilateral tinnitus    Carotid stenosis, bilateral    per duplex 02-11-2016  bilateral ICA less than50% stenosis   CKD (chronic kidney disease) stage 3, GFR 30-59 ml/min (HCC)    Coronary artery disease    Dyspnea    Erectile dysfunction    Heart murmur    History of hypertension    per pt pcp took him off medication approx. 2016   Hyperlipidemia    Hyperplasia of prostate with lower urinary tract symptoms (LUTS)    Mild acid reflux    watches diet   Nocturia more than twice per night    Pneumonia    Postcholecystectomy diarrhea  Prostate cancer [facility]) urologist-  dr wrenn/ oncologist-  dr Tammi Klippel   dx 06/ 2018-- Stage cT2c, Gleason 3+4, PSA10.1, vol 98cc-- plant external beam radiation   Seasonal allergic rhinitis    Wears glasses    Wears partial dentures    upper   White coat syndrome without diagnosis of hypertension     Past Surgical History:  Procedure Laterality Date   CATARACT EXTRACTION W/ INTRAOCULAR LENS  IMPLANT, BILATERAL  2016 approx.   COLONOSCOPY  last one 02-17-2004   GOLD SEED IMPLANT N/A 12/27/2016   Procedure: GOLD SEED IMPLANT WITH SPACE OAR;  Surgeon: Irine Seal, MD;  Location: [facility];  Service: Urology;  Laterality: N/A;   LAPAROSCOPIC  CHOLECYSTECTOMY  01-18-2003  dr Margot Chimes   PROSTATE BIOPSY  10-26-2016  dr Jeffie Pollock (office)   RIGHT HEART CATH AND CORONARY ANGIOGRAPHY N/A 04/29/2021   Procedure: RIGHT HEART CATH AND CORONARY ANGIOGRAPHY;  Surgeon: [name], MD;  Location: [facility];  Service: Cardiovascular;  Laterality: N/A;    MEDICATIONS:  acetaminophen (TYLENOL) 500 MG tablet   amLODipine (NORVASC) 5 MG tablet   Ascorbic Acid (VITAMIN C) 1000 MG tablet   aspirin 81 MG tablet   betamethasone dipropionate 0.05 % cream   Cholecalciferol (VITAMIN D3) 5000 units CAPS   dorzolamide (TRUSOPT) 2 % ophthalmic solution   loperamide (IMODIUM) 2 MG capsule   methylcellulose (CITRUCEL) oral powder   Omega-3 1000 MG CAPS   OVER THE COUNTER MEDICATION   pravastatin (PRAVACHOL) 20 MG tablet   Propylene Glycol (SYSTANE COMPLETE) 0.6 % SOLN   No current facility-administered medications for this encounter.    [START ON 05/11/2021] ceFAZolin (ANCEF) IVPB 2g/100 mL premix   [START ON 05/11/2021] dexmedetomidine (PRECEDEX) 400 MCG/100ML (4 mcg/mL) infusion   [START ON 05/11/2021] heparin 30,000 units/NS 1000 mL solution for CELLSAVER   [START ON 05/11/2021] magnesium sulfate (IV Push/IM) injection 40 mEq   [START ON 05/11/2021] norepinephrine (LEVOPHED) 4mg  in 252mL (0.016 mg/mL) premix infusion   [START ON 05/11/2021] potassium chloride injection 80 mEq   Myra Gianotti, PA-C Surgical Short Stay/Anesthesiology [facility] Phone [phone] [facility] Phone [phone] 05/10/2021 10:54 AM

## 2021-05-10 NOTE — Anesthesia Preprocedure Evaluation (Addendum)
Anesthesia Evaluation  Patient identified by MRN, date of birth, ID band Patient awake    Reviewed: Allergy & Precautions, NPO status , Patient's Chart, lab work & pertinent test results  Airway Mallampati: II  TM Distance: >3 FB Neck ROM: Full    Dental  (+) Dental Advisory Given, Caps,    Pulmonary former smoker,    Pulmonary exam normal breath sounds clear to auscultation       Cardiovascular hypertension, Pt. on medications + CAD and + Peripheral Vascular Disease  + Valvular Problems/Murmurs AS  Rhythm:Regular Rate:Normal + Systolic murmurs    Neuro/Psych negative neurological ROS  negative psych ROS   GI/Hepatic Neg liver ROS, GERD  ,  Endo/Other  negative endocrine ROS  Renal/GU Renal InsufficiencyRenal disease     Musculoskeletal negative musculoskeletal ROS (+)   Abdominal   Peds  Hematology negative hematology ROS (+)   Anesthesia Other Findings   Reproductive/Obstetrics                          Anesthesia Physical Anesthesia Plan  ASA: 4  Anesthesia Plan: MAC   Post-op Pain Management: Tylenol PO (pre-op)   Induction: Intravenous  PONV Risk Score and Plan: 1 and Treatment may vary due to age or medical condition, TIVA and Ondansetron  Airway Management Planned: Natural Airway and Simple Face Mask  Additional Equipment: Arterial line  Intra-op Plan:   Post-operative Plan:   Informed Consent: I have reviewed the patients History and Physical, chart, labs and discussed the procedure including the risks, benefits and alternatives for the proposed anesthesia with the patient or authorized representative who has indicated his/her understanding and acceptance.     Dental advisory given  Plan Discussed with: CRNA  Anesthesia Plan Comments: (PAT note written 05/10/2021 by Myra Gianotti, PA-C.  2nd PIV)      Anesthesia Quick Evaluation

## 2021-05-10 NOTE — H&P (Signed)
[address]       [place] [address]             [phone]      Cardiothoracic Surgery Admission History and Physical   PCP is Lavone Orn, MD Referring Provider is [name], MD Primary Cardiologist is [name], MD   Reason for admission: Critical aortic stenosis   HPI:     The patient has an active 88 year old gentleman with a history of hypertension, hyperlipidemia, prostate cancer status post radiation seed implant, stage III chronic kidney disease, and moderate bilateral carotid stenosis by ultrasound in 2017 who recently underwent an echocardiogram for development of progressive exertional fatigue and shortness of breath.  He said that he has had a heart murmur for several years.  He exercises on a treadmill and used to be able to walk 30 minutes without difficulty but more recently has had to reduce the speed and time that he is walking due to fatigue and shortness of breath.  He went [place] hunting recently in [place] and got very tired trying to walk through the thick brush.  He denies any chest pain or pressure.  He denies dizziness and syncope.  He denies peripheral edema or orthopnea.  An echocardiogram on 04/12/2021 showed critical aortic stenosis with a severely thickened and calcified aortic valve with restricted leaflet mobility.  The mean gradient was 69 mmHg with a peak gradient of 120 mmHg.  Dimensionless index was 0.22 with a valve area of 0.93 cm.  There is mild aortic insufficiency.  Left ventricular ejection fraction was normal at 60 to 65% with mild LVH and grade 1 diastolic dysfunction.   He lives with his wife.  He stays active hunting and fishing.  He has a remote smoking history.  He sees a dentist regularly and has partial dentures with no problems.     Past Medical History:  Diagnosis Date   Bilateral tinnitus     Carotid stenosis, bilateral      per duplex 02-11-2016  bilateral ICA less than50% stenosis   CKD (chronic  kidney disease) stage 3, GFR 30-59 ml/min (HCC)     Erectile dysfunction     History of hypertension      per pt pcp took him off medication approx. 2016   Hyperlipidemia     Hyperplasia of prostate with lower urinary tract symptoms (LUTS)     Mild acid reflux      watches diet   Nocturia more than twice per night     Postcholecystectomy diarrhea     Prostate cancer [facility]) urologist-  dr wrenn/ oncologist-  dr Tammi Klippel    dx 06/ 2018-- Stage cT2c, Gleason 3+4, PSA10.1, vol 98cc-- plant external beam radiation   Seasonal allergic rhinitis     Wears glasses     Wears partial dentures      upper   White coat syndrome without diagnosis of hypertension             Past Surgical History:  Procedure Laterality Date   CATARACT EXTRACTION W/ INTRAOCULAR LENS  IMPLANT, BILATERAL   2016 approx.   COLONOSCOPY   last one 02-17-2004   GOLD SEED IMPLANT N/A 12/27/2016    Procedure: GOLD SEED IMPLANT WITH SPACE OAR;  Surgeon: Irine Seal, MD;  Location: [facility];  Service: Urology;  Laterality: N/A;   LAPAROSCOPIC CHOLECYSTECTOMY   01-18-2003  dr Margot Chimes   PROSTATE BIOPSY   10-26-2016  dr  wrenn (office)   RIGHT HEART CATH AND CORONARY ANGIOGRAPHY N/A 04/29/2021    Procedure: RIGHT HEART CATH AND CORONARY ANGIOGRAPHY;  Surgeon: [name], MD;  Location: [facility];  Service: Cardiovascular;  Laterality: N/A;           Family History  Problem Relation Age of Onset   Cancer Neg Hx     Esophageal cancer Neg Hx     Colon cancer Neg Hx        Social History         Socioeconomic History   Marital status: Married      Spouse name: Not on file   Number of children: Not on file   Years of education: Not on file   Highest education level: Not on file  Occupational History   Not on file  Tobacco Use   Smoking status: Former      Types: Cigars, Pipe      Quit date: 05/03/1967      Years since quitting: 54.0   Smokeless tobacco: Never  Vaping Use   Vaping Use:  Never used  Substance and Sexual Activity   Alcohol use: Yes      Comment: occasional   Drug use: No   Sexual activity: Yes  Other Topics Concern   Not on file  Social History Narrative   Not on file    Social Determinants of Health    Financial Resource Strain: Not on file  Food Insecurity: Not on file  Transportation Needs: Not on file  Physical Activity: Not on file  Stress: Not on file  Social Connections: Not on file  Intimate Partner Violence: Not on file             Prior to Admission medications   Medication Sig Start Date End Date Taking? Authorizing Provider  acetaminophen (TYLENOL) 500 MG tablet Take 500 mg by mouth every 6 (six) hours as needed.     Yes [provider]  amLODipine (NORVASC) 5 MG tablet Take 5 mg by mouth daily. 02/27/21   Yes [provider]  Ascorbic Acid (VITAMIN C) 1000 MG tablet Take 1,000 mg by mouth daily.     Yes [provider]  aspirin 81 MG tablet Take 81 mg by mouth every other day.     Yes [provider]  betamethasone dipropionate 0.05 % cream Apply 1 application topically 2 (two) times daily as needed (itching/rash). 04/01/21   Yes [provider]  Cholecalciferol (VITAMIN D3) 5000 units CAPS Take 5,000 Units by mouth every morning.     Yes [provider]  dorzolamide (TRUSOPT) 2 % ophthalmic solution Place 1 drop into both eyes 2 (two) times daily. 03/10/21   Yes [provider]  loperamide (IMODIUM) 2 MG capsule Take 2 mg by mouth daily as needed for diarrhea or loose stools.     Yes [provider]  methylcellulose (CITRUCEL) oral powder Take 2-3 teaspoonful oral daily     Yes [provider]  Omega-3 1000 MG CAPS Take 1,000 mg by mouth daily.     Yes [provider]  OVER THE COUNTER MEDICATION Take 1 tablet by mouth daily. [employer]     Yes [provider]  pravastatin (PRAVACHOL) 20 MG tablet Take 20 mg by mouth every other day.      Yes [provider]  Propylene Glycol (SYSTANE COMPLETE) 0.6 % SOLN Place 1 drop into both eyes daily.  Yes [provider]            Current Outpatient Medications  Medication Sig Dispense Refill   acetaminophen (TYLENOL) 500 MG tablet Take 500 mg by mouth every 6 (six) hours as needed.       amLODipine (NORVASC) 5 MG tablet Take 5 mg by mouth daily.       Ascorbic Acid (VITAMIN C) 1000 MG tablet Take 1,000 mg by mouth daily.       aspirin 81 MG tablet Take 81 mg by mouth every other day.       betamethasone dipropionate 0.05 % cream Apply 1 application topically 2 (two) times daily as needed (itching/rash).       Cholecalciferol (VITAMIN D3) 5000 units CAPS Take 5,000 Units by mouth every morning.       dorzolamide (TRUSOPT) 2 % ophthalmic solution Place 1 drop into both eyes 2 (two) times daily.       loperamide (IMODIUM) 2 MG capsule Take 2 mg by mouth daily as needed for diarrhea or loose stools.       methylcellulose (CITRUCEL) oral powder Take 2-3 teaspoonful oral daily       Omega-3 1000 MG CAPS Take 1,000 mg by mouth daily.       OVER THE COUNTER MEDICATION Take 1 tablet by mouth daily. [employer]       pravastatin (PRAVACHOL) 20 MG tablet Take 20 mg by mouth every other day.       Propylene Glycol (SYSTANE COMPLETE) 0.6 % SOLN Place 1 drop into both eyes daily.        No current facility-administered medications for this visit.           Allergies  Allergen Reactions   Honey Bee Venom [Bee Venom] Swelling   Adhesive [Tape] Other (See Comments)      "peels skin off" use paper tape   Rosuvastatin Calcium        legs cramps   Tetanus Immune Globulin Other (See Comments)      Swollen arm          Review of Systems:               General:                      normal appetite, + decreased energy, no weight gain, no weight loss, no fever             Cardiac:                       no chest pain with exertion, no chest pain at rest, +SOB with  moderate exertion, no resting SOB, no PND, no orthopnea, no palpitations, no arrhythmia, no atrial fibrillation, no LE edema, no dizzy spells, no syncope             Respiratory:                 + exertional shortness of breath, no home oxygen, no productive cough, no dry cough, no bronchitis, no wheezing, no hemoptysis, no asthma, no pain with inspiration or cough, no sleep apnea, no CPAP at night             GI:                               no difficulty swallowing, no reflux, no frequent  heartburn, no hiatal hernia, no abdominal pain, no constipation, + diarrhea, no hematochezia, no hematemesis, no melena             GU:                              no dysuria,  + frequency, no urinary tract infection, no hematuria, no enlarged prostate, no kidney stones, no kidney disease             Vascular:                     no pain suggestive of claudication, no pain in feet, + leg cramps, no varicose veins, no DVT, no non-healing foot ulcer             Neuro:                         no stroke, no TIA's, no seizures, no headaches, no temporary blindness one eye,  no slurred speech, no peripheral neuropathy, no chronic pain, no instability of gait, no memory/cognitive dysfunction             Musculoskeletal:         no arthritis, no joint swelling, no myalgias, no difficulty walking, normal mobility              Skin:                            no rash, + itching, no skin infections, no pressure sores or ulcerations             Psych:                         no anxiety, no depression, no nervousness, no unusual recent stress             Eyes:                           no blurry vision, no floaters, no recent vision changes, + wears glasses              ENT:                            + hearing loss, no loose or painful teeth, + partial dentures, last saw dentist this year             Hematologic:               no easy bruising, no abnormal bleeding, no clotting disorder, no frequent epistaxis              Endocrine:                   no diabetes, does not check CBG's at home                            Physical Exam:               BP 136/80    Pulse 66    Resp 20    Ht 5\' 11"  (1.803 m)    Wt 178 lb (80.7 kg)    SpO2 96% Comment: RA  BMI 24.83 kg/m              General:                      Thin,  well-appearing             HEENT:                       Unremarkable, NCAT, PERLA, EOMI             Neck:                           no JVD, no bruits, no adenopathy              Chest:                          clear to auscultation, symmetrical breath sounds, no wheezes, no rhonchi              CV:                              RRR, 3/6 systolic murmur RSB, no diastolic murmur             Abdomen:                    soft, non-tender, no masses              Extremities:                 warm, well-perfused, pulses palpable at ankle, no lower extremity edema             Rectal/GU                   Deferred             Neuro:                         Grossly non-focal and symmetrical throughout             Skin:                            Clean and dry, no rashes, no breakdown   Diagnostic Tests:   ECHOCARDIOGRAM REPORT         Patient Name:   MARCUS SCHWANDT Date of Exam: 04/12/2021  Medical Rec #:  [id number]     Height:       71.0 in  Accession #:    [id number]    Weight:       173.2 lb  Date of Birth:  December 10, 1932    BSA:          1.983 m  Patient Age:    41 years      BP:           152/84 mmHg  Patient Gender: M             HR:           64 bpm.  Exam Location:  [address]   Procedure: 2D Echo, 3D Echo, Cardiac Doppler, Color Doppler and Strain  Analysis   Indications:    I35 Aortic stenosis     History:  Patient has no prior history of Echocardiogram  examinations.                  Signs/Symptoms:Shortness of Breath; Risk Factors:Former  Smoker                  and Hypertension. Prostate cancer. Anemia. CKD stage 3.     Sonographer:    Basilia Jumbo BS, RDCS  Referring Phys:  [place]     1. There is severe aortic stenosis with AVA 0.96cm2, mean gradient  38mmHg, peak gradient 164mmHg, Vmax 5.25m/s, DI 0.24 (LVOT VTI 31).   2. Left ventricular ejection fraction, by estimation, is 60 to 65%. The  left ventricle has normal function. The left ventricle has no regional  wall motion abnormalities. There is mild concentric left ventricular  hypertrophy. Left ventricular diastolic  parameters are consistent with Grade I diastolic dysfunction (impaired  relaxation). Elevated left atrial pressure. The average left ventricular  global longitudinal strain is -21.7 %. The global longitudinal strain is  normal.   3. Right ventricular systolic function is normal. The right ventricular  size is normal.   4. Left atrial size was moderately dilated.   5. The mitral valve is abnormal. Trivial mitral valve regurgitation.  Moderate mitral annular calcification.   6. The aortic valve was not well visualized. There is severe calcifcation  of the aortic valve. There is severe thickening of the aortic valve.  Aortic valve regurgitation is mild. Severe aortic valve stenosis.   7. The inferior vena cava is normal in size with greater than 50%  respiratory variability, suggesting right atrial pressure of 3 mmHg.   Comparison(s): No prior Echocardiogram.   FINDINGS   Left Ventricle: Left ventricular ejection fraction, by estimation, is 60  to 65%. The left ventricle has normal function. The left ventricle has no  regional wall motion abnormalities. The average left ventricular global  longitudinal strain is -21.7 %.  The global longitudinal strain is normal. 3D left ventricular ejection  fraction analysis performed but not reported based on interpreter  judgement due to suboptimal tracking. The left ventricular internal cavity  size was normal in size. There is mild  concentric left ventricular hypertrophy. Left ventricular diastolic  parameters are  consistent with Grade I diastolic dysfunction (impaired  relaxation). Elevated left atrial pressure.   Right Ventricle: The right ventricular size is normal. No increase in  right ventricular wall thickness. Right ventricular systolic function is  normal.   Left Atrium: Left atrial size was moderately dilated.   Right Atrium: Right atrial size was normal in size.   Pericardium: There is no evidence of pericardial effusion.   Mitral Valve: The mitral valve is abnormal. There is moderate thickening  of the mitral valve leaflet(s). Moderate mitral annular calcification.  Trivial mitral valve regurgitation. MV peak gradient, 8.9 mmHg. The mean  mitral valve gradient is 3.0 mmHg.   Tricuspid Valve: The tricuspid valve is normal in structure. Tricuspid  valve regurgitation is trivial.   Aortic Valve: Mean gradient 57mmHg, peak 120 mmHg, Vmax 5.5 m/s, DI 0.22,  AVA 0.93. The aortic valve was not well visualized. There is severe  calcifcation of the aortic valve. There is severe thickening of the aortic  valve. Aortic valve regurgitation is  mild. Aortic regurgitation PHT measures 504 msec. Severe aortic stenosis  is present. Aortic valve mean gradient measures 69.0 mmHg. Aortic valve  peak gradient measures 119.7 mmHg. Aortic valve area, by VTI measures  1.09  cm.   Pulmonic Valve: The pulmonic valve was normal in structure. Pulmonic valve  regurgitation is trivial.   Aorta: The aortic root and ascending aorta are structurally normal, with  no evidence of dilitation.   Venous: The inferior vena cava is normal in size with greater than 50%  respiratory variability, suggesting right atrial pressure of 3 mmHg.   IAS/Shunts: No atrial level shunt detected by color flow Doppler.      LEFT VENTRICLE  PLAX 2D  LVIDd:         4.70 cm   Diastology  LVIDs:         3.15 cm   LV e' medial:    5.33 cm/s  LV PW:         0.90 cm   LV E/e' medial:  18.5  LV IVS:        1.10 cm   LV e'  lateral:   6.75 cm/s  LVOT diam:     2.37 cm   LV E/e' lateral: 14.6  LV SV:         141  LV SV Index:   71        2D Longitudinal Strain  LVOT Area:     4.42 cm  2D Strain GLS (A2C):   -22.1 %                           2D Strain GLS (A3C):   -19.8 %                           2D Strain GLS (A4C):   -23.1 %                           2D Strain GLS Avg:     -21.7 %                              3D Volume EF:                           3D EF:        56 %                           LV EDV:       144 ml                           LV ESV:       64 ml                           LV SV:        80 ml   RIGHT VENTRICLE             IVC  RV Basal diam:  3.20 cm     IVC diam: 1.60 cm  RV S prime:     13.50 cm/s  TAPSE (M-mode): 2.1 cm   LEFT ATRIUM             Index        RIGHT ATRIUM           Index  LA diam:  4.50 cm 2.27 cm/m   RA Pressure: 3.00 mmHg  LA Vol (A2C):   80.3 ml 40.48 ml/m  RA Area:     12.80 cm  LA Vol (A4C):   63.9 ml 32.22 ml/m  RA Volume:   25.80 ml  13.01 ml/m  LA Biplane Vol: 71.2 ml 35.90 ml/m   AORTIC VALVE  AV Area (Vmax):    1.04 cm  AV Area (Vmean):   1.11 cm  AV Area (VTI):     1.09 cm  AV Vmax:           547.00 cm/s  AV Vmean:          358.000 cm/s  AV VTI:            1.290 m  AV Peak Grad:      119.7 mmHg  AV Mean Grad:      69.0 mmHg  LVOT Vmax:         128.33 cm/s  LVOT Vmean:        90.100 cm/s  LVOT VTI:          0.318 m  LVOT/AV VTI ratio: 0.25  AI PHT:            504 msec     AORTA  Ao Root diam: 3.50 cm  Ao Asc diam:  3.60 cm   MITRAL VALVE                TRICUSPID VALVE  MV Area (PHT): 2.03 cm     Estimated RAP:  3.00 mmHg  MV Peak grad:  8.9 mmHg  MV Mean grad:  3.0 mmHg     SHUNTS  MV Vmax:       1.49 m/s     Systemic VTI:  0.32 m  MV Vmean:      74.3 cm/s    Systemic Diam: 2.37 cm  MV Decel Time: 373 msec  MV E velocity: 98.50 cm/s  MV A velocity: 148.00 cm/s  MV E/A ratio:  0.67   Gwyndolyn Kaufman MD  Electronically  signed by Gwyndolyn Kaufman MD  Signature Date/Time: 04/12/2021/1:58:40 PM         Final       Physicians   Panel Physicians Referring Physician Case Authorizing Physician  [name], MD (Primary)        Procedures   RIGHT HEART CATH AND CORONARY ANGIOGRAPHY    Conclusion       Prox RCA lesion is 30% stenosed.   Prox LAD lesion is 40% stenosed.   1st Diag lesion is 70% stenosed.   2nd Diag lesion is 90% stenosed.   1st Mrg lesion is 100% stenosed.   There is severe aortic valve stenosis.   Patent left main, LAD, dominant RCA, and large intermediate branches with mild nonobstructive disease Severe diagonal stenosis, small to moderate caliber vessels Chronic occlusion of a small OM1 branch Known critical aortic stenosis with heavy calcification and restriction of the aortic valve on fluoroscopy Normal right heart pressures   Plan: medical therapy for CAD, continue TAVR evaluation   Indications   Severe aortic stenosis [I35.0 (ICD-10-CM)]    Procedural Details   Technical Details INDICATION: Severe aortic stenosis. Pre-TAVR evaluation.  PROCEDURAL DETAILS: There was an indwelling IV in a right antecubital vein. Using normal sterile technique, the IV was changed out for a 5 Fr brachial sheath over a 0.018 inch wire. The right wrist was then prepped, draped, and anesthetized with 1% lidocaine. Using  the modified Seldinger technique a 5/6 French Slender sheath was placed in the right radial artery. Intra-arterial verapamil was administered through the radial artery sheath. IV heparin was administered after a JR4 catheter was advanced into the central aorta. A Swan-Ganz catheter was used for the right heart catheterization. Standard protocol was followed for recording of right heart pressures and sampling of oxygen saturations. Fick cardiac output was calculated. Standard Judkins catheters were used for selective coronary angiography. There were no immediate procedural  complications. The patient was transferred to the post catheterization recovery area for further monitoring.      Estimated blood loss <50 mL.   During this procedure medications were administered to achieve and maintain moderate conscious sedation while the patient's heart rate, blood pressure, and oxygen saturation were continuously monitored and I was present face-to-face 100% of this time.    Medications (Filter: Administrations occurring from 0731 to 0809 on 04/29/21)  important  Continuous medications are totaled by the amount administered until 04/29/21 0809.    fentaNYL (SUBLIMAZE) injection (mcg) Total dose:  25 mcg Date/Time Rate/Dose/Volume Action    04/29/21 0742 25 mcg Given      midazolam (VERSED) injection (mg) Total dose:  1 mg Date/Time Rate/Dose/Volume Action    04/29/21 0742 1 mg Given      lidocaine (PF) (XYLOCAINE) 1 % injection (mL) Total volume:  5 mL Date/Time Rate/Dose/Volume Action    04/29/21 0750 5 mL Given      Radial Cocktail/Verapamil only (mL) Total volume:  10 mL Date/Time Rate/Dose/Volume Action    04/29/21 0750 10 mL Given      heparin sodium (porcine) injection (Units) Total dose:  4,000 Units Date/Time Rate/Dose/Volume Action    04/29/21 0757 4,000 Units Given      Heparin (Porcine) in NaCl 1000-0.9 UT/500ML-% SOLN (mL) Total volume:  1,000 mL Date/Time Rate/Dose/Volume Action    04/29/21 0809 500 mL Given    0809 500 mL Given      iohexol (OMNIPAQUE) 350 MG/ML injection (mL) Total volume:  50 mL Date/Time Rate/Dose/Volume Action    04/29/21 0809 50 mL Given      Sedation Time   Sedation Time Physician-1: 22 minutes 11 seconds Contrast   Medication Name Total Dose  iohexol (OMNIPAQUE) 350 MG/ML injection 50 mL    Radiation/Fluoro   Fluoro time: 3.7 (min) DAP: 94174 (mGycm2) Cumulative Air Kerma: 081 (mGy) Complications      Complications documented before study signed (04/29/2021  4:48 AM)     No complications  were associated with this study.  Documented by Larose Hires, RN - 04/29/2021  8:09 AM      Coronary Findings   Diagnostic Dominance: Right Left Main  The vessel exhibits minimal luminal irregularities.    Left Anterior Descending  The vessel exhibits minimal luminal irregularities.  Prox LAD lesion is 40% stenosed.    First Diagonal Branch  1st Diag lesion is 70% stenosed.    Second Diagonal Branch  2nd Diag lesion is 90% stenosed.    Ramus Intermedius  Vessel is large. The vessel exhibits minimal luminal irregularities.    Left Circumflex    First Obtuse Marginal Branch  1st Mrg lesion is 100% stenosed. CTO, small vessel    Right Coronary Artery  Vessel is large. The vessel exhibits minimal luminal irregularities.  Prox RCA lesion is 30% stenosed.    Intervention    No interventions have been documented.    Left Heart   Aortic Valve There is  severe aortic valve stenosis. The aortic valve is calcified. There is restricted aortic valve motion.    Coronary Diagrams   Diagnostic Dominance: Right Intervention   Implants      No implant documentation for this case.    [employer] Images    Show images for CARDIAC CATHETERIZATION Images on Long Term Storage    Show images for Xavien, Dauphinais to Procedure Log   Procedure Log    Hemo Data   Flowsheet Row Most Recent Value  Fick Cardiac Output 6.61 L/min  Fick Cardiac Output Index 3.26 (L/min)/BSA  RA A Wave 9 mmHg  RA V Wave 5 mmHg  RA Mean 3 mmHg  RV Systolic Pressure 33 mmHg  RV Diastolic Pressure -1 mmHg  RV EDP 9 mmHg  PA Systolic Pressure 33 mmHg  PA Diastolic Pressure 11 mmHg  PA Mean 21 mmHg  PW A Wave 17 mmHg  PW V Wave 19 mmHg  PW Mean 14 mmHg  AO Systolic Pressure 267 mmHg  AO Diastolic Pressure 67 mmHg  AO Mean 91 mmHg  QP/QS 1  TPVR Index 6.45 HRUI  TSVR Index 28.56 HRUI  PVR SVR Ratio 0.1  TPVR/TSVR Ratio 0.23    ADDENDUM REPORT: 04/22/2021 19:23   CLINICAL DATA:  68M  with severe aortic stenosis being evaluated for a TAVR procedure.   EXAM: Cardiac TAVR CT   TECHNIQUE: The patient was scanned on a [employer]. A 120 kV retrospective scan was triggered in the descending thoracic aorta at 111 HU's. Gantry rotation speed was 250 msecs and collimation was .6 mm. No beta blockade or nitro were given. The 3D data set was reconstructed in 5% intervals of the R-R cycle. Systolic and diastolic phases were analyzed on a dedicated work station using MPR, MIP and VRT modes. The patient received 80 cc of contrast.   FINDINGS: Aortic Root:   Aortic valve: Tricuspid   Aortic valve calcium score: 4635   Aortic annulus:   Diameter: 76mm x 55mm   Perimeter: 40mm   Area: 503 mm^2   Calcifications: Moderate calcification adjacent to noncoronary cusp   Coronary height: Min Left - 82mm, Max Left - 45mm; Min Right - 30mm   Sinotubular height: Left cusp - 66mm; Right cusp - 67mm; Noncoronary cusp - 4mm   LVOT (as measured 3 mm below the annulus):   Diameter: 18mm x 68mm   Area: 463 mm^2   Calcifications: Calcification inferior to noncoronary cusp   Aortic sinus width: Left cusp - 40mm; Right cusp - 4mm; Noncoronary cusp - 64mm   Sinotubular junction width: 31mm x 20mm   Optimum Fluoroscopic Angle for Delivery: LAO 4 CAU 24   Cardiac:   Right atrium: Mild enlargement   Right ventricle: Mild enlargement   Pulmonary arteries: Normal size   Pulmonary veins: Normal configuration   Left atrium: Mild enlargement   Left ventricle: Normal size   Pericardium: Normal thickness   Coronary arteries: Coronary calcium score 389   IMPRESSION: 1. Trileafet aortic valve with severe calcifications (AV calcium score 4635)   2. Aortic annulus measures 84mm x 37mm in diameter with perimeter 80mm and area 503 mm^2. Moderate annular calcification adjacent to noncoronary cusp and extending into LVOT. Annular measurements suitable for  delivery of 105mm Edwards Sapien 3 valve   3. Sufficient coronary to annulus distance, measuring 38mm to left main and 49mm to RCA   4. Optimum Fluoroscopic Angle for Delivery:  LAO 4 CAU 24  5. Coronary calcium score 389     Electronically Signed   By: Oswaldo Milian M.D.   On: 04/22/2021 19:23    Addended by Donato Heinz, MD on 04/22/2021  7:25 PM    Study Result   Narrative & Impression  EXAM: OVER-READ INTERPRETATION  CT CHEST   The following report is an over-read performed by radiologist Dr. Salvatore Marvel of [facility], [place] on 04/22/2021. This over-read does not include interpretation of cardiac or coronary anatomy or pathology. The coronary CTA interpretation by the cardiologist is attached.   COMPARISON:  None.   FINDINGS: Please see the separate concurrent chest CT angiogram report for details.   IMPRESSION: Please see the separate concurrent chest CT angiogram report for details.   Electronically Signed: By: Ilona Sorrel M.D. On: 04/22/2021 14:29        Narrative & Impression  CLINICAL DATA:  Aortic valve replacement (TAVR), pre-op eval. Severe aortic stenosis.   EXAM: CT ANGIOGRAPHY CHEST, ABDOMEN AND PELVIS   TECHNIQUE: Multidetector CT imaging through the chest, abdomen and pelvis was performed using the standard protocol during bolus administration of intravenous contrast. Multiplanar reconstructed images and MIPs were obtained and reviewed to evaluate the vascular anatomy.   CONTRAST:  25mL OMNIPAQUE IOHEXOL 350 MG/ML SOLN   COMPARISON:  12/06/2016 Fluciclovine PET-CT.   FINDINGS: CTA CHEST FINDINGS   Cardiovascular: Top-normal heart size. Coarse calcification and diffuse thickening of the aortic valve. No significant pericardial effusion/thickening. Left anterior descending and right coronary atherosclerosis. Atherosclerotic nonaneurysmal thoracic aorta. Normal caliber pulmonary arteries. No central pulmonary  emboli.   Mediastinum/Nodes: No discrete thyroid nodules. Unremarkable esophagus. No axillary adenopathy. Enlarged 1.0 cm short axis diameter AP window node (series 11/image 56). Enlarged 1.0 cm right hilar node (series 11/image 60). Enlarged 1.1 cm left hilar node (series 11/image 70). These mildly enlarged thoracic nodes are unchanged from 12/06/2016 PET-CT.   Lungs/Pleura: No pneumothorax. No pleural effusion. No acute consolidative airspace disease or lung masses. Solid posterior left lower lobe pulmonary nodules, largest 5 mm (series 12/image 77), for which comparison to 12/06/2016 PET-CT cannot be made due to hypoventilation on prior PET-CT. Moderate patchy subpleural reticulation and ground-glass opacity throughout both lungs with a mild basilar predominance.   Musculoskeletal:  No aggressive appearing focal osseous lesions.   CTA ABDOMEN AND PELVIS FINDINGS   Hepatobiliary: Normal liver with no liver mass. Cholecystectomy. No biliary ductal dilatation.   Pancreas: Normal, with no mass or duct dilation.   Spleen: Normal size spleen. Hypodense 1.5 cm central splenic lesion (series 11/image 111) appears stable since 12/06/2016 head CT, considered benign.   Adrenals/Urinary Tract: Normal adrenals. No contour deforming renal masses. No hydronephrosis. Chronic diffuse bladder wall thickening. Bladder is nondistended.   Stomach/Bowel: Normal non-distended stomach. Normal caliber small bowel with no small bowel wall thickening. Normal appendix. Marked diffuse colonic diverticulosis, most prominent in the sigmoid colon, with no large bowel wall thickening or significant pericolonic fat stranding.   Vascular/Lymphatic: Atherosclerotic nonaneurysmal abdominal aorta. No pathologically enlarged lymph nodes in the abdomen or pelvis.   Reproductive: Moderate to marked prostatomegaly. Fiducial markers scattered in the prostate.   Other: No pneumoperitoneum, ascites or focal  fluid collection.   Musculoskeletal: No aggressive appearing focal osseous lesions.   VASCULAR MEASUREMENTS PERTINENT TO TAVR:   AORTA:   Minimal Aortic Diameter-16.5 x 16.2 mm   Severity of Aortic Calcification-moderate to severe   RIGHT PELVIS:   Right Common Iliac Artery -   Minimal Diameter-11.5 x  9.8 mm   Tortuosity-mild   Calcification-moderate   Right External Iliac Artery -   Minimal Diameter-8.8 x 8.4 mm   Tortuosity-moderate   Calcification-mild   Right Common Femoral Artery -   Minimal Diameter-9.2 x 8.6 mm   Tortuosity-mild   Calcification-moderate   LEFT PELVIS:   Left Common Iliac Artery -   Minimal Diameter-11.4 x 9.4 mm   Tortuosity-mild   Calcification-mild   Left External Iliac Artery -   Minimal Diameter-9.9 x 9.4 mm   Tortuosity-mild-to-moderate   Calcification-mild   Left Common Femoral Artery -   Minimal Diameter-10.0 x 9.2 mm   Tortuosity-mild   Calcification-moderate   Review of the MIP images confirms the above findings.   IMPRESSION: 1. Vascular findings and measurements pertinent to potential TAVR procedure, as detailed. 2. Diffuse thickening and coarse calcification of the aortic valve, compatible with the reported clinical history of severe aortic stenosis. 3. Two-vessel coronary atherosclerosis. 4. Moderate patchy subpleural reticulation and ground-glass opacity throughout both lungs with a mild basilar predominance, nonspecific, suggestive of fibrotic interstitial lung disease, with UIP not excluded. Pulmonology consultation and high-resolution chest CT evaluation may be obtained as clinically warranted. 5. Solid posterior left lower lobe pulmonary nodules, largest 5 mm. Follow-up chest CT suggested in 3-6 months given history of prostate cancer. 6. Mild mediastinal and bilateral hilar lymphadenopathy is stable since 12/06/2016 PET-CT, nonspecific, favor reactive. 7. Marked diffuse colonic  diverticulosis. 8. Chronic diffuse bladder wall thickening, probably due to chronic bladder outlet obstruction by the enlarged prostate. 9. Aortic Atherosclerosis (ICD10-I70.0).     Electronically Signed   By: Ilona Sorrel M.D.   On: 04/22/2021 14:56      STS Risk Calculator: Risk of Mortality: 1.715% Renal Failure: 2.597% Permanent Stroke: 1.425% Prolonged Ventilation: 4.807% DSW Infection: 0.071% Reoperation: 3.856% Morbidity or Mortality: 9.712% Short Length of Stay: 36.210% Long Length of Stay: 4.253%   Impression:   This 88 year old gentleman has stage D, critical, symptomatic aortic stenosis with New York Heart Association class II symptoms of exertional fatigue and shortness of breath consistent with chronic diastolic congestive heart failure.  I have personally reviewed his 2D echocardiogram, cardiac catheterization, and CTA studies.  His echo shows a severely calcified and thickened aortic valve with a mean gradient of 69 mmHg consistent with critical aortic stenosis.  Left ventricular ejection fraction is normal.  Cardiac catheterization shows three-vessel coronary disease with chronic occlusion of a small first marginal branch, severe stenosis of a small to moderate size diagonal branch, and mild nonobstructive RCA and LAD stenosis.  This can be managed medically.  I agree that aortic valve replacement is indicated in this patient for relief of his symptoms and to prevent progressive left ventricular deterioration.  Given his age I think transcatheter aortic valve replacement would be the best treatment.  His gated cardiac CTA shows anatomy suitable for TAVR using a SAPIEN 3 valve.  His abdominal and pelvic CTA shows adequate pelvic vascular anatomy to allow transfemoral insertion.   The patient and his wife were counseled at length regarding treatment alternatives for management of severe symptomatic aortic stenosis. The risks and benefits of surgical intervention has  been discussed in detail. Long-term prognosis with medical therapy was discussed. Alternative approaches such as conventional surgical aortic valve replacement, transcatheter aortic valve replacement, and palliative medical therapy were compared and contrasted at length. This discussion was placed in the context of the patient's own specific clinical presentation and past medical history. All of their questions have  been addressed.    Following the decision to proceed with transcatheter aortic valve replacement, a discussion was held regarding what types of management strategies would be attempted intraoperatively in the event of life-threatening complications, including whether or not the patient would be considered a candidate for the use of cardiopulmonary bypass and/or conversion to open sternotomy for attempted surgical intervention.  Despite his advanced age he is in good overall condition and I think he would be a candidate for emergent sternotomy to manage any intraoperative complications.  The patient is aware of the fact that transient use of cardiopulmonary bypass may be necessary. The patient has been advised of a variety of complications that might develop including but not limited to risks of death, stroke, paravalvular leak, aortic dissection or other major vascular complications, aortic annulus rupture, device embolization, cardiac rupture or perforation, mitral regurgitation, acute myocardial infarction, arrhythmia, heart block or bradycardia requiring permanent pacemaker placement, congestive heart failure, respiratory failure, renal failure, pneumonia, infection, other late complications related to structural valve deterioration or migration, or other complications that might ultimately cause a temporary or permanent loss of functional independence or other long term morbidity. The patient provides full informed consent for the procedure as described and all questions were answered.        Plan:   Transfemoral TAVR using a SAPIEN 3 valve.        Gaye Pollack, MD

## 2021-05-11 ENCOUNTER — Encounter (HOSPITAL_COMMUNITY): Payer: Self-pay | Admitting: Cardiovascular Disease

## 2021-05-11 ENCOUNTER — Inpatient Hospital Stay (HOSPITAL_COMMUNITY): Payer: Medicare Other

## 2021-05-11 ENCOUNTER — Other Ambulatory Visit: Payer: Self-pay | Admitting: Physician Assistant

## 2021-05-11 ENCOUNTER — Other Ambulatory Visit: Payer: Self-pay

## 2021-05-11 ENCOUNTER — Inpatient Hospital Stay (HOSPITAL_COMMUNITY): Payer: Medicare Other | Admitting: Physician Assistant

## 2021-05-11 ENCOUNTER — Encounter (HOSPITAL_COMMUNITY)
Admission: RE | Disposition: A | Discharge status: Home / Self Care | Payer: Self-pay | Source: Home / Self Care | Attending: Cardiovascular Disease

## 2021-05-11 ENCOUNTER — Inpatient Hospital Stay (HOSPITAL_COMMUNITY)
Admission: RE | Admit: 2021-05-11 | Discharge: 2021-05-12 | DRG: 267 | Disposition: A | Discharge status: Home / Self Care | Payer: Medicare Other | Attending: Cardiovascular Disease | Admitting: Cardiovascular Disease

## 2021-05-11 DIAGNOSIS — D6489 Other specified anemias: Secondary | ICD-10-CM | POA: Diagnosis present

## 2021-05-11 DIAGNOSIS — Z20822 Contact with and (suspected) exposure to covid-19: Secondary | ICD-10-CM | POA: Diagnosis present

## 2021-05-11 DIAGNOSIS — I2510 Atherosclerotic heart disease of native coronary artery without angina pectoris: Secondary | ICD-10-CM | POA: Diagnosis present

## 2021-05-11 DIAGNOSIS — E785 Hyperlipidemia, unspecified: Secondary | ICD-10-CM | POA: Diagnosis present

## 2021-05-11 DIAGNOSIS — Z87891 Personal history of nicotine dependence: Secondary | ICD-10-CM | POA: Diagnosis not present

## 2021-05-11 DIAGNOSIS — I6523 Occlusion and stenosis of bilateral carotid arteries: Secondary | ICD-10-CM | POA: Diagnosis present

## 2021-05-11 DIAGNOSIS — I447 Left bundle-branch block, unspecified: Secondary | ICD-10-CM | POA: Diagnosis present

## 2021-05-11 DIAGNOSIS — I351 Nonrheumatic aortic (valve) insufficiency: Secondary | ICD-10-CM

## 2021-05-11 DIAGNOSIS — N400 Enlarged prostate without lower urinary tract symptoms: Secondary | ICD-10-CM | POA: Diagnosis present

## 2021-05-11 DIAGNOSIS — I5032 Chronic diastolic (congestive) heart failure: Secondary | ICD-10-CM | POA: Diagnosis present

## 2021-05-11 DIAGNOSIS — Z952 Presence of prosthetic heart valve: Secondary | ICD-10-CM

## 2021-05-11 DIAGNOSIS — I440 Atrioventricular block, first degree: Secondary | ICD-10-CM | POA: Diagnosis present

## 2021-05-11 DIAGNOSIS — Z452 Encounter for adjustment and management of vascular access device: Secondary | ICD-10-CM

## 2021-05-11 DIAGNOSIS — Z923 Personal history of irradiation: Secondary | ICD-10-CM

## 2021-05-11 DIAGNOSIS — I130 Hypertensive heart and chronic kidney disease with heart failure and stage 1 through stage 4 chronic kidney disease, or unspecified chronic kidney disease: Secondary | ICD-10-CM | POA: Diagnosis present

## 2021-05-11 DIAGNOSIS — Z7982 Long term (current) use of aspirin: Secondary | ICD-10-CM | POA: Diagnosis not present

## 2021-05-11 DIAGNOSIS — R918 Other nonspecific abnormal finding of lung field: Secondary | ICD-10-CM | POA: Diagnosis present

## 2021-05-11 DIAGNOSIS — N183 Chronic kidney disease, stage 3 unspecified: Secondary | ICD-10-CM

## 2021-05-11 DIAGNOSIS — Z79899 Other long term (current) drug therapy: Secondary | ICD-10-CM | POA: Diagnosis not present

## 2021-05-11 DIAGNOSIS — Z8546 Personal history of malignant neoplasm of prostate: Secondary | ICD-10-CM

## 2021-05-11 DIAGNOSIS — Z006 Encounter for examination for normal comparison and control in clinical research program: Secondary | ICD-10-CM

## 2021-05-11 DIAGNOSIS — K219 Gastro-esophageal reflux disease without esophagitis: Secondary | ICD-10-CM | POA: Diagnosis present

## 2021-05-11 DIAGNOSIS — I350 Nonrheumatic aortic (valve) stenosis: Secondary | ICD-10-CM | POA: Diagnosis present

## 2021-05-11 DIAGNOSIS — C61 Malignant neoplasm of prostate: Secondary | ICD-10-CM | POA: Diagnosis present

## 2021-05-11 DIAGNOSIS — R030 Elevated blood-pressure reading, without diagnosis of hypertension: Secondary | ICD-10-CM

## 2021-05-11 HISTORY — DX: Presence of prosthetic heart valve: Z95.2

## 2021-05-11 HISTORY — PX: INTRAOPERATIVE TRANSTHORACIC ECHOCARDIOGRAM: SHX6523

## 2021-05-11 HISTORY — PX: TRANSCATHETER AORTIC VALVE REPLACEMENT, TRANSFEMORAL: SHX6400

## 2021-05-11 HISTORY — PX: ULTRASOUND GUIDANCE FOR VASCULAR ACCESS: SHX6516

## 2021-05-11 LAB — ECHOCARDIOGRAM LIMITED
AR max vel: 2.41 cm2
AV Area VTI: 2.17 cm2
AV Area mean vel: 2.24 cm2
AV Mean grad: 5 mmHg
AV Peak grad: 9 mmHg
Ao pk vel: 1.5 m/s
Calc EF: 75.6 %
Single Plane A2C EF: 74.9 %
Single Plane A4C EF: 76.9 %

## 2021-05-11 LAB — POCT I-STAT, CHEM 8
BUN: 19 mg/dL (ref 8–23)
Calcium, Ion: 1.21 mmol/L (ref 1.15–1.40)
Chloride: 105 mmol/L (ref 98–111)
Creatinine, Ser: 1.5 mg/dL — ABNORMAL HIGH (ref 0.61–1.24)
Glucose, Bld: 135 mg/dL — ABNORMAL HIGH (ref 70–99)
HCT: 35 % — ABNORMAL LOW (ref 39.0–52.0)
Hemoglobin: 11.9 g/dL — ABNORMAL LOW (ref 13.0–17.0)
Potassium: 4.2 mmol/L (ref 3.5–5.1)
Sodium: 139 mmol/L (ref 135–145)
TCO2: 22 mmol/L (ref 22–32)

## 2021-05-11 LAB — ABO/RH: ABO/RH(D): A POS

## 2021-05-11 SURGERY — IMPLANTATION, AORTIC VALVE, TRANSCATHETER, FEMORAL APPROACH
Anesthesia: Monitor Anesthesia Care | Site: Groin | Laterality: Left

## 2021-05-11 MED ORDER — HEPARIN 6000 UNIT IRRIGATION SOLUTION
Status: AC
  Filled 2021-05-11: qty 500

## 2021-05-11 MED ORDER — HEPARIN 6000 UNIT IRRIGATION SOLUTION
Status: AC
  Filled 2021-05-11: qty 1000

## 2021-05-11 MED ORDER — PRAVASTATIN SODIUM 10 MG PO TABS
20.0000 mg | ORAL_TABLET | ORAL | Status: DC
  Administered 2021-05-12: 20 mg via ORAL
  Filled 2021-05-11: qty 2

## 2021-05-11 MED ORDER — ASCORBIC ACID 500 MG PO TABS
1000.0000 mg | ORAL_TABLET | Freq: Every day | ORAL | Status: DC
  Administered 2021-05-12: 1000 mg via ORAL
  Filled 2021-05-11: qty 2

## 2021-05-11 MED ORDER — LIDOCAINE HCL 1 % IJ SOLN
INTRAMUSCULAR | Status: AC
  Filled 2021-05-11: qty 20

## 2021-05-11 MED ORDER — HEPARIN 6000 UNIT IRRIGATION SOLUTION
Status: DC | PRN
  Administered 2021-05-11 (×3): 1

## 2021-05-11 MED ORDER — AMLODIPINE BESYLATE 5 MG PO TABS
5.0000 mg | ORAL_TABLET | Freq: Every day | ORAL | Status: DC
  Administered 2021-05-12: 5 mg via ORAL
  Filled 2021-05-11: qty 1

## 2021-05-11 MED ORDER — LIDOCAINE HCL 1 % IJ SOLN
INTRAMUSCULAR | Status: DC | PRN
  Administered 2021-05-11: 12 mL

## 2021-05-11 MED ORDER — ONDANSETRON HCL 4 MG/2ML IJ SOLN: 4.0000 mg | Freq: Four times a day (QID) | INTRAMUSCULAR | Status: DC | PRN

## 2021-05-11 MED ORDER — CHLORHEXIDINE GLUCONATE 0.12 % MT SOLN
15.0000 mL | Freq: Once | OROMUCOSAL | Status: AC
  Administered 2021-05-11: 15 mL via OROMUCOSAL
  Filled 2021-05-11: qty 15

## 2021-05-11 MED ORDER — NITROGLYCERIN IN D5W 200-5 MCG/ML-% IV SOLN: 0.0000 ug/min | INTRAVENOUS | Status: DC

## 2021-05-11 MED ORDER — EPHEDRINE SULFATE-NACL 50-0.9 MG/10ML-% IV SOSY
PREFILLED_SYRINGE | INTRAVENOUS | Status: DC | PRN
Start: 2021-05-11 — End: 2021-05-11
  Administered 2021-05-11 (×2): 5 mg via INTRAVENOUS

## 2021-05-11 MED ORDER — VANCOMYCIN HCL IN DEXTROSE 1-5 GM/200ML-% IV SOLN
1000.0000 mg | Freq: Once | INTRAVENOUS | Status: AC
  Administered 2021-05-11: 1000 mg via INTRAVENOUS
  Filled 2021-05-11: qty 200

## 2021-05-11 MED ORDER — FENTANYL CITRATE (PF) 250 MCG/5ML IJ SOLN
INTRAMUSCULAR | Status: DC | PRN
  Administered 2021-05-11: 50 ug via INTRAVENOUS
  Administered 2021-05-11: 25 ug via INTRAVENOUS

## 2021-05-11 MED ORDER — ACETAMINOPHEN 500 MG PO TABS
1000.0000 mg | ORAL_TABLET | Freq: Once | ORAL | Status: AC
Start: 2021-05-11 — End: 2021-05-11
  Administered 2021-05-11: 1000 mg via ORAL
  Filled 2021-05-11: qty 2

## 2021-05-11 MED ORDER — OXYCODONE HCL 5 MG PO TABS: 5.0000 mg | ORAL_TABLET | ORAL | Status: DC | PRN

## 2021-05-11 MED ORDER — SODIUM CHLORIDE 0.9 % IV SOLN: INTRAVENOUS | Status: DC

## 2021-05-11 MED ORDER — IODIXANOL 320 MG/ML IV SOLN
INTRAVENOUS | Status: DC | PRN
  Administered 2021-05-11 (×2): 100 mL via INTRA_ARTERIAL

## 2021-05-11 MED ORDER — SODIUM CHLORIDE 0.9% FLUSH: 3.0000 mL | INTRAVENOUS | Status: DC | PRN

## 2021-05-11 MED ORDER — LOPERAMIDE HCL 2 MG PO CAPS: 2.0000 mg | ORAL_CAPSULE | Freq: Every day | ORAL | Status: DC | PRN

## 2021-05-11 MED ORDER — 0.9 % SODIUM CHLORIDE (POUR BTL) OPTIME
TOPICAL | Status: DC | PRN
  Administered 2021-05-11: 1000 mL

## 2021-05-11 MED ORDER — PROTAMINE SULFATE 10 MG/ML IV SOLN
INTRAVENOUS | Status: DC | PRN
Start: 2021-05-11 — End: 2021-05-11
  Administered 2021-05-11: 130 mg via INTRAVENOUS

## 2021-05-11 MED ORDER — SODIUM CHLORIDE 0.9 % IV SOLN: INTRAVENOUS | Status: AC

## 2021-05-11 MED ORDER — VANCOMYCIN HCL IN DEXTROSE 1-5 GM/200ML-% IV SOLN
INTRAVENOUS | Status: AC
  Filled 2021-05-11: qty 200

## 2021-05-11 MED ORDER — SODIUM CHLORIDE 0.9% FLUSH
3.0000 mL | Freq: Two times a day (BID) | INTRAVENOUS | Status: DC
  Administered 2021-05-11 – 2021-05-12 (×2): 3 mL via INTRAVENOUS

## 2021-05-11 MED ORDER — MORPHINE SULFATE (PF) 2 MG/ML IV SOLN: 1.0000 mg | INTRAVENOUS | Status: DC | PRN

## 2021-05-11 MED ORDER — PROPOFOL 500 MG/50ML IV EMUL
INTRAVENOUS | Status: DC | PRN
  Administered 2021-05-11: 20 ug/kg/min via INTRAVENOUS

## 2021-05-11 MED ORDER — CHLORHEXIDINE GLUCONATE 4 % EX LIQD: 30.0000 mL | CUTANEOUS | Status: DC

## 2021-05-11 MED ORDER — CHLORHEXIDINE GLUCONATE 4 % EX LIQD: 60.0000 mL | Freq: Once | CUTANEOUS | Status: DC

## 2021-05-11 MED ORDER — HEPARIN SODIUM (PORCINE) 1000 UNIT/ML IJ SOLN
INTRAMUSCULAR | Status: DC | PRN
  Administered 2021-05-11: 13000 [IU] via INTRAVENOUS

## 2021-05-11 MED ORDER — PROTAMINE SULFATE 10 MG/ML IV SOLN
INTRAVENOUS | Status: AC
  Filled 2021-05-11: qty 15

## 2021-05-11 MED ORDER — HEPARIN SODIUM (PORCINE) 1000 UNIT/ML IJ SOLN
INTRAMUSCULAR | Status: AC
  Filled 2021-05-11: qty 10

## 2021-05-11 MED ORDER — PROPOFOL 10 MG/ML IV BOLUS
INTRAVENOUS | Status: AC
  Filled 2021-05-11: qty 20

## 2021-05-11 MED ORDER — SODIUM CHLORIDE 0.9 % IV SOLN: 250.0000 mL | INTRAVENOUS | Status: DC | PRN

## 2021-05-11 MED ORDER — ASPIRIN EC 81 MG PO TBEC
81.0000 mg | DELAYED_RELEASE_TABLET | ORAL | Status: DC
  Administered 2021-05-12: 81 mg via ORAL
  Filled 2021-05-11: qty 1

## 2021-05-11 MED ORDER — CEFAZOLIN SODIUM-DEXTROSE 2-4 GM/100ML-% IV SOLN: 2.0000 g | Freq: Three times a day (TID) | INTRAVENOUS | Status: DC

## 2021-05-11 MED ORDER — TRAMADOL HCL 50 MG PO TABS: 50.0000 mg | ORAL_TABLET | ORAL | Status: DC | PRN

## 2021-05-11 MED ORDER — ACETAMINOPHEN 650 MG RE SUPP: 650.0000 mg | Freq: Four times a day (QID) | RECTAL | Status: DC | PRN

## 2021-05-11 MED ORDER — FENTANYL CITRATE (PF) 250 MCG/5ML IJ SOLN
INTRAMUSCULAR | Status: AC
  Filled 2021-05-11: qty 5

## 2021-05-11 MED ORDER — LACTATED RINGERS IV SOLN
INTRAVENOUS | Status: DC | PRN
Start: 2021-05-11 — End: 2021-05-11

## 2021-05-11 MED ORDER — ACETAMINOPHEN 325 MG PO TABS: 650.0000 mg | ORAL_TABLET | Freq: Four times a day (QID) | ORAL | Status: DC | PRN

## 2021-05-11 MED ORDER — VANCOMYCIN HCL IN DEXTROSE 1-5 GM/200ML-% IV SOLN
1000.0000 mg | Freq: Once | INTRAVENOUS | Status: AC
  Administered 2021-05-11: 1000 mg via INTRAVENOUS

## 2021-05-11 SURGICAL SUPPLY — 54 items
BAG DECANTER FOR FLEXI CONT (MISCELLANEOUS) ×2 IMPLANT
BALLN TRUE 18X4.5 (BALLOONS) ×3
BALLN TRUE 18X4.5CM (BALLOONS) ×1
BALLOON TRUE 18X4.5 (BALLOONS) IMPLANT
CABLE ADAPT CONN TEMP 6FT (ADAPTER) ×4 IMPLANT
CATH DIAG EXPO 6F VENT PIG 145 (CATHETERS) ×8 IMPLANT
CATH INFINITI 6F AL2 (CATHETERS) ×2 IMPLANT
CATH S G BIP PACING (CATHETERS) ×4 IMPLANT
CHLORAPREP W/TINT 26 (MISCELLANEOUS) ×4 IMPLANT
CLOSURE MYNX CONTROL 6F/7F (Vascular Products) ×2 IMPLANT
CNTNR URN SCR LID CUP LEK RST (MISCELLANEOUS) ×4 IMPLANT
CONT SPEC 4OZ STRL OR WHT (MISCELLANEOUS) ×4
DECANTER SPIKE VIAL GLASS SM (MISCELLANEOUS) ×4 IMPLANT
DERMABOND ADVANCED (GAUZE/BANDAGES/DRESSINGS) ×2
DERMABOND ADVANCED .7 DNX12 (GAUZE/BANDAGES/DRESSINGS) ×2 IMPLANT
DEVICE CLOSURE PERCLS PRGLD 6F (VASCULAR PRODUCTS) ×4 IMPLANT
DRSG TEGADERM 4X4.75 (GAUZE/BANDAGES/DRESSINGS) ×8 IMPLANT
ELECT REM PT RETURN 9FT ADLT (ELECTROSURGICAL) ×4
ELECTRODE REM PT RTRN 9FT ADLT (ELECTROSURGICAL) ×2 IMPLANT
GAUZE SPONGE 4X4 12PLY STRL (GAUZE/BANDAGES/DRESSINGS) ×4 IMPLANT
GLOVE SURG ENC MOIS LTX SZ7.5 (GLOVE) IMPLANT
GLOVE SURG ENC MOIS LTX SZ8 (GLOVE) IMPLANT
GLOVE SURG ORTHO LTX SZ7.5 (GLOVE) IMPLANT
GOWN STRL REUS W/ TWL LRG LVL3 (GOWN DISPOSABLE) IMPLANT
GOWN STRL REUS W/ TWL XL LVL3 (GOWN DISPOSABLE) ×2 IMPLANT
GOWN STRL REUS W/TWL LRG LVL3 (GOWN DISPOSABLE)
GOWN STRL REUS W/TWL XL LVL3 (GOWN DISPOSABLE) ×4
GUIDEWIRE SAF TJ AMPL .035X180 (WIRE) ×4 IMPLANT
GUIDEWIRE SAFE TJ AMPLATZ EXST (WIRE) ×4 IMPLANT
KIT BASIN OR (CUSTOM PROCEDURE TRAY) ×4 IMPLANT
KIT HEART LEFT (KITS) ×4 IMPLANT
KIT SAPIAN 3 ULTRA RESILIA 26 (Valve) ×2 IMPLANT
KIT TURNOVER KIT B (KITS) ×4 IMPLANT
NS IRRIG 1000ML POUR BTL (IV SOLUTION) ×4 IMPLANT
PACK ENDO MINOR (CUSTOM PROCEDURE TRAY) ×4 IMPLANT
PAD ARMBOARD 7.5X6 YLW CONV (MISCELLANEOUS) ×8 IMPLANT
PAD ELECT DEFIB RADIOL ZOLL (MISCELLANEOUS) ×4 IMPLANT
PERCLOSE PROGLIDE 6F (VASCULAR PRODUCTS) ×8
POSITIONER HEAD DONUT 9IN (MISCELLANEOUS) ×4 IMPLANT
SET MICROPUNCTURE 5F STIFF (MISCELLANEOUS) ×4 IMPLANT
SHEATH BRITE TIP 7FR 35CM (SHEATH) ×4 IMPLANT
SHEATH PINNACLE 6F 10CM (SHEATH) ×4 IMPLANT
SHEATH PINNACLE 8F 10CM (SHEATH) ×4 IMPLANT
SLEEVE REPOSITIONING LENGTH 30 (MISCELLANEOUS) ×4 IMPLANT
STOPCOCK MORSE 400PSI 3WAY (MISCELLANEOUS) ×8 IMPLANT
SUT SILK  1 MH (SUTURE) ×3
SUT SILK 1 MH (SUTURE) ×2 IMPLANT
SYR 50ML LL SCALE MARK (SYRINGE) ×6 IMPLANT
SYR BULB IRRIG 60ML STRL (SYRINGE) IMPLANT
TOWEL GREEN STERILE (TOWEL DISPOSABLE) ×8 IMPLANT
TRANSDUCER W/STOPCOCK (MISCELLANEOUS) ×8 IMPLANT
TUBE SUCT INTRACARD DLP 20F (MISCELLANEOUS) IMPLANT
WIRE EMERALD 3MM-J .035X150CM (WIRE) ×4 IMPLANT
WIRE EMERALD 3MM-J .035X260CM (WIRE) ×4 IMPLANT

## 2021-05-11 NOTE — Progress Notes (Signed)
°  Echocardiogram 2D Echocardiogram has been performed.  Rodney Patterson 05/11/2021, 9:22 AM

## 2021-05-11 NOTE — Anesthesia Procedure Notes (Signed)
Central Venous Catheter Insertion Performed by: Catalina Gravel, MD, anesthesiologist Start/End1/01/2022 7:15 AM, 05/11/2021 7:25 AM Preanesthetic checklist: patient identified, IV checked, site marked, risks and benefits discussed, surgical consent, monitors and equipment checked, pre-op evaluation, timeout performed and anesthesia consent Position: Trendelenburg Lidocaine 1% used for infiltration and patient sedated Hand hygiene performed , maximum sterile barriers used  and Seldinger technique used Catheter size: 8 Fr Total catheter length 16. Central line was placed.Double lumen Procedure performed using ultrasound guided technique. Ultrasound Notes:anatomy identified, needle tip was noted to be adjacent to the nerve/plexus identified, no ultrasound evidence of intravascular and/or intraneural injection and image(s) printed for medical record Attempts: 1 Following insertion, line sutured, dressing applied and Biopatch. Post procedure assessment: blood return through all ports, free fluid flow and no air  Patient tolerated the procedure well with no immediate complications.

## 2021-05-11 NOTE — Discharge Instructions (Signed)

## 2021-05-11 NOTE — Progress Notes (Signed)
Mobility Specialist: Progress Note   05/11/21 1622  Mobility  Activity Ambulated in hall  Level of Assistance Minimal assist, patient does 75% or more  Assistive Device None  Distance Ambulated (ft) 340 ft  Mobility Ambulated with assistance in hallway  Mobility Response Tolerated well  Mobility performed by Mobility specialist  Bed Position Chair  $Mobility charge 1 Mobility   Pre-Mobility: 52 HR, 100% SpO2 Post-Mobility: 56 HR  Pt required minA to stand and contact guard throughout ambulation with no c/o. Pt to recliner after walk with call bell and phone in reach.   [facility] Dhyana Bastone Mobility Specialist Mobility Specialist 4 [place]: [phone] Mobility Specialist 2 [place] and [place]: [phone]

## 2021-05-11 NOTE — Anesthesia Postprocedure Evaluation (Signed)
Anesthesia Post Note  Patient: Rodney Patterson  Procedure(s) Performed: TRANSCATHETER AORTIC VALVE REPLACEMENT, TRANSFEMORAL (Bilateral: Groin) INTRAOPERATIVE TRANSTHORACIC ECHOCARDIOGRAM (Left: Chest) ULTRASOUND GUIDANCE FOR VASCULAR ACCESS (Bilateral: Groin)     Patient location during evaluation: Cath Lab Anesthesia Type: MAC Level of consciousness: awake and alert Pain management: pain level controlled Vital Signs Assessment: post-procedure vital signs reviewed and stable Respiratory status: spontaneous breathing, nonlabored ventilation, respiratory function stable and patient connected to nasal cannula oxygen Cardiovascular status: stable and blood pressure returned to baseline Postop Assessment: no apparent nausea or vomiting Anesthetic complications: no   No notable events documented.  Last Vitals:  Vitals:   05/11/21 1026 05/11/21 1117  BP: 111/76 (!) 109/55  Pulse: (!) 51 (!) 48  Resp: 11 13  Temp: (!) 36.4 C (!) 36.3 C  SpO2:  98%    Last Pain:  Vitals:   05/11/21 1117  TempSrc: Axillary  PainSc: 0-No pain                 Catalina Gravel

## 2021-05-11 NOTE — Interval H&P Note (Signed)
History and Physical Interval Note:  05/11/2021 6:40 AM  Rodney Patterson  has presented today for surgery, with the diagnosis of Critical Aortic Stenosis.  The various methods of treatment have been discussed with the patient and family. After consideration of risks, benefits and other options for treatment, the patient has consented to  Procedure(s): TRANSCATHETER AORTIC VALVE REPLACEMENT, TRANSFEMORAL (N/A) INTRAOPERATIVE TRANSTHORACIC ECHOCARDIOGRAM (N/A) as a surgical intervention.  The patient's history has been reviewed, patient examined, no change in status, stable for surgery.  I have reviewed the patient's chart and labs.  Questions were answered to the patient's satisfaction.     Gaye Pollack

## 2021-05-11 NOTE — Transfer of Care (Addendum)
Immediate Anesthesia Transfer of Care Note  Patient: TEJ MURDAUGH  Procedure(s) Performed: TRANSCATHETER AORTIC VALVE REPLACEMENT, TRANSFEMORAL (Bilateral: Groin) INTRAOPERATIVE TRANSTHORACIC ECHOCARDIOGRAM (Left: Chest) ULTRASOUND GUIDANCE FOR VASCULAR ACCESS (Bilateral: Groin)  Patient Location: PACU  Anesthesia Type:MAC  Level of Consciousness: drowsy  Airway & Oxygen Therapy: Patient Spontanous Breathing and Patient connected to face mask oxygen  Post-op Assessment: Report given to RN and Post -op Vital signs reviewed and stable  Post vital signs: Reviewed and stable  Last Vitals:  Vitals Value Taken Time  BP 114/57 05/11/21 0942  Temp    Pulse 58 05/11/21 0942  Resp 12 05/11/21 0942  SpO2 91 % 05/11/21 0942  Vitals shown include unvalidated device data.  Last Pain:  Vitals:   05/11/21 0610  TempSrc: Oral  PainSc:          Complications: No notable events documented.

## 2021-05-11 NOTE — Anesthesia Procedure Notes (Signed)
Arterial Line Insertion Start/End1/01/2022 6:55 AM, 05/11/2021 7:00 AM  Patient location: Pre-op. Preanesthetic checklist: patient identified, IV checked, site marked, risks and benefits discussed, surgical consent, monitors and equipment checked, pre-op evaluation, timeout performed and anesthesia consent Lidocaine 1% used for infiltration Right, radial was placed Catheter size: 20 G Hand hygiene performed  and maximum sterile barriers used   Attempts: 1 Procedure performed without using ultrasound guided technique. Following insertion, dressing applied and Biopatch. Post procedure assessment: normal and unchanged

## 2021-05-11 NOTE — Progress Notes (Signed)
Arrived from Cath Lab.  A&OX3.  CHG. CCMD notified telebox [id number].  Vital signs per protocol.  Bilateral groins intact-level 0

## 2021-05-11 NOTE — Progress Notes (Signed)
°  [place] VALVE TEAM  Patient doing well s/p TAVR. He is hemodynamically stable. Groin sites stable. ECG with sinus brady with new first deg AV block and LBBB but no high grade block. Arterial line discontinued and transferred to 4E. Given central line placement will obtain a CXR. Also, I initially had placed an order for cefazolin but since pt was MRSA + on pre surgical screen, I changed surgical prophylaxic abx to Vanc 1000mg .   Plan for early ambulation after bedrest completed and hopeful discharge over the next 24-48 hours.   Angelena Form PA-C  [facility]  Pager [phone]

## 2021-05-11 NOTE — Op Note (Signed)
[facility]   MULTIDISCIPLINARY HEART VALVE TEAM   TAVR OPERATIVE NOTE   Date of Procedure:  05/11/2021  Preoperative Diagnosis: Severe Aortic Stenosis   Postoperative Diagnosis: Same   Procedure:   Transcatheter Aortic Valve Replacement - Percutaneous Transfemoral Approach  Edwards Sapien 3 Resilia THV (size 26 mm, serial # [id number])   Co-Surgeons:  Gaye Pollack, MD and [name], MD  Anesthesiologist:  Dr Gifford Shave  Echocardiographer:  Dr Croitoru  Pre-operative Echo Findings: Critical aortic stenosis Normal left ventricular systolic function  Post-operative Echo Findings: Trace paravalvular leak Normal/unchanged left ventricular systolic function  BRIEF CLINICAL NOTE AND INDICATIONS FOR SURGERY  88 yo gentleman with critical symptomatic aortic stenosis. The patient is in good health with no other significant medical problems. He has developed a NHYA functional class II limitation of fatigue and exertional dyspnea. He has undergone extensive preoperative testing and multidisciplinary team evaluation, felt to be an appropriate candidate for transfemoral TAVR with a 26 mm Sapien 3 Resilia valve.   During the course of the patient's preoperative work up they have been evaluated comprehensively by a multidisciplinary team of specialists coordinated through the [facility] in the [facility].  They have been demonstrated to suffer from symptomatic severe aortic stenosis as noted above. The patient has been counseled extensively as to the relative risks and benefits of all options for the treatment of severe aortic stenosis including long term medical therapy, conventional surgery for aortic valve replacement, and transcatheter aortic valve replacement.  The patient has been independently evaluated in formal cardiac surgical consultation by Dr Cyndia Bent, who deemed the patient appropriate for TAVR. Based upon review of  all of the patient's preoperative diagnostic tests they are felt to be candidate for transcatheter aortic valve replacement using the transfemoral approach as an alternative to conventional surgery.    Following the decision to proceed with transcatheter aortic valve replacement, a discussion has been held regarding what types of management strategies would be attempted intraoperatively in the event of life-threatening complications, including whether or not the patient would be considered a candidate for the use of cardiopulmonary bypass and/or conversion to open sternotomy for attempted surgical intervention.  The patient has been advised of a variety of complications that might develop peculiar to this approach including but not limited to risks of death, stroke, paravalvular leak, aortic dissection or other major vascular complications, aortic annulus rupture, device embolization, cardiac rupture or perforation, acute myocardial infarction, arrhythmia, heart block or bradycardia requiring permanent pacemaker placement, congestive heart failure, respiratory failure, renal failure, pneumonia, infection, other late complications related to structural valve deterioration or migration, or other complications that might ultimately cause a temporary or permanent loss of functional independence or other long term morbidity.  The patient provides full informed consent for the procedure as described and all questions were answered preoperatively.  DETAILS OF THE OPERATIVE PROCEDURE  PREPARATION:   The patient is brought to the operating room on the above mentioned date and central monitoring was established by the anesthesia team including placement of a radial arterial line. The patient is placed in the supine position on the operating table.  Intravenous antibiotics are administered. The patient is monitored closely throughout the procedure under conscious sedation.  Baseline transthoracic echocardiogram is  performed. The patient's chest, abdomen, both groins, and both lower extremities are prepared and draped in a sterile manner. A time out procedure is performed.   PERIPHERAL ACCESS:   Using ultrasound  guidance, femoral arterial and venous access is obtained with placement of 6 Fr sheaths on the left side.  [place] images are digitally captured and stored in the patient's chart. A pigtail diagnostic catheter was passed through the femoral arterial sheath under fluoroscopic guidance into the aortic root.  A temporary transvenous pacemaker catheter was passed through the femoral venous sheath under fluoroscopic guidance into the right ventricle.  The pacemaker was tested to ensure stable lead placement and pacemaker capture. Aortic root angiography was performed in order to determine the optimal angiographic angle for valve deployment.  TRANSFEMORAL ACCESS:  A micropuncture technique is used to access the right femoral artery under fluoroscopic and ultrasound guidance.  2 Perclose devices are deployed at 10' and 2' positions to 'PreClose' the femoral artery. An 8 French sheath is placed and then an Amplatz Superstiff wire is advanced through the sheath. This is changed out for a 14 French transfemoral E-Sheath after progressively dilating over the Superstiff wire.  An AL-2 catheter was used to direct a straight-tip exchange length wire across the native aortic valve into the left ventricle. This was exchanged out for a pigtail catheter and position was confirmed in the LV apex. Simultaneous LV and Ao pressures were recorded.  The pigtail catheter was exchanged for an Amplatz Extra-stiff wire in the LV apex.    BALLOON AORTIC VALVULOPLASTY:  Performed with an 18 mm Bard True balloon with rapid ventricular pacing. The patient's recovery is rapid.   TRANSCATHETER HEART VALVE DEPLOYMENT:  An Edwards Sapien 3 transcatheter heart valve (size 26 mm) was prepared and crimped per manufacturer's guidelines, and the  proper orientation of the valve is confirmed on the [employer] delivery system. The valve was advanced through the introducer sheath using normal technique until in an appropriate position in the abdominal aorta beyond the sheath tip. The balloon was then retracted and using the fine-tuning wheel was centered on the valve. The valve was then advanced across the aortic arch using appropriate flexion of the catheter. The valve was carefully positioned across the aortic valve annulus. The Commander catheter was retracted using normal technique. Once final position of the valve has been confirmed by angiographic assessment, the valve is deployed while temporarily holding ventilation and during rapid ventricular pacing to maintain systolic blood pressure < 50 mmHg and pulse pressure < 10 mmHg. The balloon inflation is held for >3 seconds after reaching full deployment volume. Once the balloon has fully deflated the balloon is retracted into the ascending aorta and valve function is assessed using echocardiography. The patient's hemodynamic recovery following valve deployment is good.  The deployment balloon and guidewire are both removed. Echo demostrated acceptable post-procedural gradients, stable mitral valve function, and trace aortic insufficiency.    PROCEDURE COMPLETION:  The sheath was removed and femoral artery closure is performed using the 2 previously deployed Perclose devices.  Protamine is administered once femoral arterial repair was complete. The site is clear with no evidence of bleeding or hematoma after the sutures are tightened. The temporary pacemaker and pigtail catheters are removed. Mynx closure is used for contralateral femoral arterial hemostasis for the 6 Fr sheath.  The patient tolerated the procedure well and is transported to the recovery area in stable condition. There were no immediate intraoperative complications. All sponge instrument and needle counts are verified correct  at completion of the operation.   The patient received a total of 60 mL of intravenous contrast during the procedure.   [name], MD 05/11/2021 9:50 AM

## 2021-05-11 NOTE — Progress Notes (Signed)
Called Dr. Burt Knack regarding positive MRSA.  Dr. Burt Knack changed abx to Vanc 1 gm.

## 2021-05-11 NOTE — Progress Notes (Signed)
°  Transition of Care [facility]) Screening Note   Patient Details  Name: JOANATHAN AFFELDT Date of Birth: 04-Jan-1933   Transition of Care [facility]) CM/SW Contact:    Vinie Sill, LCSW Phone Number: 05/11/2021, 4:16 PM    Transition of Care Department [facility]) has reviewed patient and no TOC needs have been identified at this time. We will continue to monitor patient advancement through interdisciplinary progression rounds. If new patient transition needs arise, please place a TOC consult.

## 2021-05-11 NOTE — Anesthesia Procedure Notes (Signed)
Procedure Name: MAC Date/Time: 05/11/2021 7:58 AM Performed by: Erick Colace, CRNA Pre-anesthesia Checklist: Patient identified, Emergency Drugs available, Suction available and Patient being monitored Patient Re-evaluated:Patient Re-evaluated prior to induction Oxygen Delivery Method: Simple face mask Induction Type: IV induction

## 2021-05-11 NOTE — Op Note (Signed)
[facility]   MULTIDISCIPLINARY HEART VALVE TEAM   TAVR OPERATIVE NOTE   Date of Procedure:  05/11/2021  Preoperative Diagnosis: Severe Aortic Stenosis   Postoperative Diagnosis: Same   Procedure:   Transcatheter Aortic Valve Replacement - Percutaneous Right Transfemoral Approach  Edwards Sapien 3 Ultra Resilia THV (size 26 mm, model # 9755RSL, serial # [id number])   Co-Surgeons:  Gaye Pollack, MD and [name], MD   Anesthesiologist:  S. Gifford Shave, MD  Echocardiographer:  Jerilynn Mages. Croitoru, MD  Pre-operative Echo Findings: Critical aortic stenosis Normal left ventricular systolic function  Post-operative Echo Findings: Trace paravalvular leak Normal left ventricular systolic function   BRIEF CLINICAL NOTE AND INDICATIONS FOR SURGERY  This 88 year old gentleman has stage D, critical, symptomatic aortic stenosis with New York Heart Association class II symptoms of exertional fatigue and shortness of breath consistent with chronic diastolic congestive heart failure.  I have personally reviewed his 2D echocardiogram, cardiac catheterization, and CTA studies.  His echo shows a severely calcified and thickened aortic valve with a mean gradient of 69 mmHg consistent with critical aortic stenosis.  Left ventricular ejection fraction is normal.  Cardiac catheterization shows three-vessel coronary disease with chronic occlusion of a small first marginal branch, severe stenosis of a small to moderate size diagonal branch, and mild nonobstructive RCA and LAD stenosis.  This can be managed medically.  I agree that aortic valve replacement is indicated in this patient for relief of his symptoms and to prevent progressive left ventricular deterioration.  Given his age I think transcatheter aortic valve replacement would be the best treatment.  His gated cardiac CTA shows anatomy suitable for TAVR using a SAPIEN 3 valve.  His abdominal and pelvic CTA shows adequate pelvic vascular anatomy  to allow transfemoral insertion.   The patient and his wife were counseled at length regarding treatment alternatives for management of severe symptomatic aortic stenosis. The risks and benefits of surgical intervention has been discussed in detail. Long-term prognosis with medical therapy was discussed. Alternative approaches such as conventional surgical aortic valve replacement, transcatheter aortic valve replacement, and palliative medical therapy were compared and contrasted at length. This discussion was placed in the context of the patient's own specific clinical presentation and past medical history. All of their questions have been addressed.    Following the decision to proceed with transcatheter aortic valve replacement, a discussion was held regarding what types of management strategies would be attempted intraoperatively in the event of life-threatening complications, including whether or not the patient would be considered a candidate for the use of cardiopulmonary bypass and/or conversion to open sternotomy for attempted surgical intervention.  Despite his advanced age he is in good overall condition and I think he would be a candidate for emergent sternotomy to manage any intraoperative complications.  The patient is aware of the fact that transient use of cardiopulmonary bypass may be necessary. The patient has been advised of a variety of complications that might develop including but not limited to risks of death, stroke, paravalvular leak, aortic dissection or other major vascular complications, aortic annulus rupture, device embolization, cardiac rupture or perforation, mitral regurgitation, acute myocardial infarction, arrhythmia, heart block or bradycardia requiring permanent pacemaker placement, congestive heart failure, respiratory failure, renal failure, pneumonia, infection, other late complications related to structural valve deterioration or migration, or other complications that  might ultimately cause a temporary or permanent loss of functional independence or other long term morbidity. The patient provides full informed consent for the procedure  as described and all questions were answered.     DETAILS OF THE OPERATIVE PROCEDURE  PREPARATION:    The patient was brought to the operating room on the above mentioned date and appropriate monitoring was established by the anesthesia team. The patient was placed in the supine position on the operating table.  Intravenous antibiotics were administered. The patient was monitored closely throughout the procedure under conscious sedation.   Baseline transthoracic echocardiogram was performed. The patient's abdomen and both groins were prepped and draped in a sterile manner. A time out procedure was performed.   PERIPHERAL ACCESS:    Using the modified Seldinger technique, femoral arterial and venous access was obtained with placement of 6 Fr sheaths on the left side.  A pigtail diagnostic catheter was passed through the left arterial sheath under fluoroscopic guidance into the aortic root.  A temporary transvenous pacemaker catheter was passed through the left femoral venous sheath under fluoroscopic guidance into the right ventricle.  The pacemaker was tested to ensure stable lead placement and pacemaker capture. Aortic root angiography was performed in order to determine the optimal angiographic angle for valve deployment.   TRANSFEMORAL ACCESS:   Percutaneous transfemoral access and sheath placement was performed using ultrasound guidance.  The right common femoral artery was cannulated using a micropuncture needle and appropriate location was verified using hand injection angiogram.  A pair of [employer] Perclose percutaneous closure devices were placed and a 6 French sheath replaced into the femoral artery.  The patient was heparinized systemically and ACT verified > 250 seconds.    A 14 Fr transfemoral E-sheath was introduced  into the right common femoral artery after progressively dilating over an Amplatz superstiff wire. An AL-1 catheter was used to direct a straight-tip exchange length wire across the native aortic valve into the left ventricle. This was exchanged out for a pigtail catheter and position was confirmed in the LV apex. Simultaneous LV and Ao pressures were recorded.  The pigtail catheter was exchanged for an Amplatz Extra-stiff wire in the LV apex.     BALLOON AORTIC VALVULOPLASTY:   Balloon aortic valvuloplasty was performed using an 18 mm True balloon.  Once optimal position was achieved, BAV was done under rapid ventricular pacing. The patient recovered well hemodynamically.    TRANSCATHETER HEART VALVE DEPLOYMENT:   An Edwards Sapien 3 Ultra transcatheter heart valve (size 26 mm) was prepared and crimped per manufacturer's guidelines, and the proper orientation of the valve is confirmed on the [employer] delivery system. The valve was advanced through the introducer sheath using normal technique until in an appropriate position in the abdominal aorta beyond the sheath tip. The balloon was then retracted and using the fine-tuning wheel was centered on the valve. The valve was then advanced across the aortic arch using appropriate flexion of the catheter. The valve was carefully positioned across the aortic valve annulus. The Commander catheter was retracted using normal technique. Once final position of the valve has been confirmed by angiographic assessment, the valve is deployed while temporarily holding ventilation and during rapid ventricular pacing to maintain systolic blood pressure < 50 mmHg and pulse pressure < 10 mmHg. The balloon inflation is held for >3 seconds after reaching full deployment volume. Once the balloon has fully deflated the balloon is retracted into the ascending aorta and valve function is assessed using echocardiography. There is felt to be trace paravalvular leak and no  central aortic insufficiency.  The patient's hemodynamic recovery following valve deployment is good.  The deployment balloon and guidewire are both removed.    PROCEDURE COMPLETION:   The sheath was removed and femoral artery closure performed.  Protamine was administered once femoral arterial repair was complete. The temporary pacemaker, pigtail catheters and femoral sheaths were removed with manual pressure used for hemostasis.  A Mynx femoral closure device was utilized following removal of the diagnostic sheath in the left femoral artery.  The patient tolerated the procedure well and is transported to the cath lab recovery area in stable condition. There were no immediate intraoperative complications. All sponge instrument and needle counts are verified correct at completion of the operation.   No blood products were administered during the operation.  The patient received a total of 60 mL of intravenous contrast during the procedure.   Gaye Pollack, MD 05/11/2021

## 2021-05-12 ENCOUNTER — Encounter (HOSPITAL_COMMUNITY): Payer: Self-pay | Admitting: Cardiovascular Disease

## 2021-05-12 ENCOUNTER — Inpatient Hospital Stay (HOSPITAL_COMMUNITY): Payer: Medicare Other

## 2021-05-12 DIAGNOSIS — Z952 Presence of prosthetic heart valve: Secondary | ICD-10-CM

## 2021-05-12 DIAGNOSIS — I350 Nonrheumatic aortic (valve) stenosis: Principal | ICD-10-CM

## 2021-05-12 LAB — CBC
HCT: 35.4 % — ABNORMAL LOW (ref 39.0–52.0)
Hemoglobin: 12.2 g/dL — ABNORMAL LOW (ref 13.0–17.0)
MCH: 33.4 pg (ref 26.0–34.0)
MCHC: 34.5 g/dL (ref 30.0–36.0)
MCV: 97 fL (ref 80.0–100.0)
Platelets: 140 10*3/uL — ABNORMAL LOW (ref 150–400)
RBC: 3.65 MIL/uL — ABNORMAL LOW (ref 4.22–5.81)
RDW: 12 % (ref 11.5–15.5)
WBC: 9 10*3/uL (ref 4.0–10.5)
nRBC: 0 % (ref 0.0–0.2)

## 2021-05-12 LAB — ECHOCARDIOGRAM COMPLETE
AV Mean grad: 11 mmHg
AV Peak grad: 20.2 mmHg
Ao pk vel: 2.25 m/s
Area-P 1/2: 2.62 cm2
Weight: 2892.44 oz

## 2021-05-12 LAB — BASIC METABOLIC PANEL
Anion gap: 10 (ref 5–15)
BUN: 20 mg/dL (ref 8–23)
CO2: 22 mmol/L (ref 22–32)
Calcium: 8.4 mg/dL — ABNORMAL LOW (ref 8.9–10.3)
Chloride: 105 mmol/L (ref 98–111)
Creatinine, Ser: 1.48 mg/dL — ABNORMAL HIGH (ref 0.61–1.24)
GFR, Estimated: 46 mL/min — ABNORMAL LOW (ref >60)
Glucose, Bld: 110 mg/dL — ABNORMAL HIGH (ref 70–99)
Potassium: 3.9 mmol/L (ref 3.5–5.1)
Sodium: 137 mmol/L (ref 135–145)

## 2021-05-12 LAB — MAGNESIUM: Magnesium: 1.8 mg/dL (ref 1.7–2.4)

## 2021-05-12 MED ORDER — ASPIRIN 81 MG PO TABS: 81.0000 mg | ORAL_TABLET | Freq: Every day | ORAL | Status: AC

## 2021-05-12 MED FILL — Potassium Chloride Inj 2 mEq/ML: INTRAVENOUS | Qty: 40 | Status: AC

## 2021-05-12 MED FILL — Magnesium Sulfate Inj 50%: INTRAMUSCULAR | Qty: 10 | Status: AC

## 2021-05-12 MED FILL — Heparin Sodium (Porcine) Inj 1000 Unit/ML: Qty: 1000 | Status: AC

## 2021-05-12 NOTE — Progress Notes (Signed)
CARDIAC REHAB PHASE I   Offered to walk with pt. Pt declines, states he walked this morning for the entire hallway. Pt states he cannot see a difference in his breathing yet, but feels good. Reviewed site care, restrictions, and exercise guidelines with pt. Questions/concerns addressed. Pt declines CRP II at this time. Pt anxious to d/c.   [id number] Rufina Falco, RN BSN 05/12/2021 9:20 AM

## 2021-05-12 NOTE — Progress Notes (Signed)
Pt discharging from 4E to home with wife. Removed central line, catheter intact, held pressure for 5 minutes, and had pt lie flat for 30 minutes. Reviewed d/c instruction for site care and follow up visits. Medication education provided.   Raelyn Number, RN

## 2021-05-12 NOTE — Discharge Summary (Addendum)
[place] VALVE TEAM  Discharge Summary    Patient ID: Rodney Patterson MRN: [id number]; DOB: 12/04/1932  Admit date: 05/11/2021 Discharge date: 05/12/2021  Primary Care Provider: Lavone Orn, MD  Primary Cardiologist: [name], MD   Discharge Diagnoses    Principal Problem:   S/P TAVR (transcatheter aortic valve replacement) Active Problems:   Malignant neoplasm of prostate (HCC)   Severe aortic stenosis   CKD (chronic kidney disease) stage 3, GFR 30-59 ml/min (HCC)   Carotid stenosis, bilateral   Hyperlipidemia   White coat syndrome without diagnosis of hypertension   Allergies Allergies  Allergen Reactions   Honey Bee Venom [Bee Venom] Swelling   Adhesive [Tape] Other (See Comments)    "peels skin off" use paper tape   Rosuvastatin Calcium     legs cramps   Tetanus Immune Globulin Swelling    Swollen arm    Diagnostic Studies/Procedures      TAVR OPERATIVE NOTE     Date of Procedure:                05/11/2021   Preoperative Diagnosis:      Severe Aortic Stenosis    Postoperative Diagnosis:    Same    Procedure:        Transcatheter Aortic Valve Replacement - Percutaneous Right Transfemoral Approach             Edwards Sapien 3 Ultra Resilia THV (size 26 mm, model # [id number], serial # [id number])              Co-Surgeons:                        Gaye Pollack, MD and [name], MD    Anesthesiologist:                  Collins Scotland, MD   Echocardiographer:              Bertrum Sol, MD   Pre-operative Echo Findings: Critical aortic stenosis Normal left ventricular systolic function   Post-operative Echo Findings: Trace paravalvular leak Normal left ventricular systolic function   _____________    Echo 05/12/21: completed but pending formal read at the time of discharge   History of Present Illness     Rodney Patterson is a 88 y.o. male with a history of prostate CA s/p seed XRT, HLD, HTN, CKD stage  II, CAD, and critical AS who presented to [facility] on 05/11/21 for planned TAVR.   The patient has been healthy and active. He decleoped exertional fatigue and dyspnea. He had a murmur on exam prompting an echo by his PCP. Echo 04/12/21 showed EF 60-65% with critical AS with a mean gradient of 69 mm hg, peak 120 mm hg, AVA 0.93 cm2, DVI 0.22, mild AI. [facility] 04/29/21 showed patent left main, LAD, dominant RCA, and large intermediate branches with mild nonobstructive disease as well as a severe diagonal stenosis, small to moderate caliber vessels and chronic occlusion of a small OM1 branch. Medical therapy was recommended.   The patient has been evaluated by the multidisciplinary valve team and felt to have severe, symptomatic aortic stenosis and to be a suitable candidate for TAVR, which was set up for 05/11/21.   Hospital Course     Consultants: none  Severe AS: s/p successful TAVR with a 26 mm Edwards Sapien 3 Ultra Resilia THV via the TF approach on 05/11/21.  Post operative echo completed but pending formal read: per my personal review LV function looks normal with normal TAVR function with a mean gradient of 8 mm hg and no significant PVL. Groin sites are stable. ECG/tele with sinus  and no high grade heart block (new LBBB and 1st deg AV block on initial post op EG have resolved.) Continue Asprin 81 mg daily alone (previously taking QOD). Plan for discharge home today with close follow up in the office next week.    CAD: [facility] 04/29/21 showed patent left main, LAD, dominant RCA, and large intermediate branches with mild nonobstructive disease as well as a severe diagonal stenosis, small to moderate caliber vessels and chronic occlusion of a small OM1 branch. No chest pain. Continue medical therapy   HTN: BP overall well controlled. Resumed home Norvasc 5mg  daily. Will continue to monitor.  Abnormal lung CT: pre TAVR CTs showed moderate patchy subpleural reticulation and ground-glass opacity throughout  both lungs with a mild basilar predominance, nonspecific, suggestive of fibrotic interstitial lung disease, with UIP not excluded. Pulmonology consultation and high-resolution chest CT evaluation may be obtained as clinically warranted. This will be discussed as an outpatient.  Pulmonary nodules: pre TAVR CT showed solid posterior left lower lobe pulmonary nodules, largest 5 mm. Follow-up chest CT suggested in 3-6 months given history of prostate cancer. This will be discussed as an outpatient. _____________  Discharge Vitals Blood pressure (!) 141/69, pulse 75, temperature 98.5 F (36.9 C), temperature source Oral, resp. rate 15, weight 82 kg, SpO2 93 %.  Filed Weights   05/12/21 0452  Weight: 82 kg     GEN: Well nourished, well developed, in no acute distress HEENT: normal Neck: no JVD or masses Cardiac: RRR; soft flow murmur. No rubs, or gallops,no edema  Respiratory:  clear to auscultation bilaterally, normal work of breathing GI: soft, nontender, nondistended, + BS MS: no deformity or atrophy Skin: warm and dry, no rash.  Groin sites clear without hematoma or ecchymosis  Neuro:  Alert and Oriented x 3, Strength and sensation are intact Psych: euthymic mood, full affect   Labs & Radiologic Studies    CBC Recent Labs    05/11/21 1022 05/12/21 0356  WBC  --  9.0  HGB 11.9* 12.2*  HCT 35.0* 35.4*  MCV  --  97.0  PLT  --  703*   Basic Metabolic Panel Recent Labs    05/11/21 1022 05/12/21 0356  NA 139 137  K 4.2 3.9  CL 105 105  CO2  --  22  GLUCOSE 135* 110*  BUN 19 20  CREATININE 1.50* 1.48*  CALCIUM  --  8.4*  MG  --  1.8   Liver Function Tests No results for input(s): AST, ALT, ALKPHOS, BILITOT, PROT, ALBUMIN in the last 72 hours. No results for input(s): LIPASE, AMYLASE in the last 72 hours. Cardiac Enzymes No results for input(s): CKTOTAL, CKMB, CKMBINDEX, TROPONINI in the last 72 hours. BNP Invalid input(s): POCBNP D-Dimer No results for input(s):  DDIMER in the last 72 hours. Hemoglobin A1C No results for input(s): HGBA1C in the last 72 hours. Fasting Lipid Panel No results for input(s): CHOL, HDL, LDLCALC, TRIG, CHOLHDL, LDLDIRECT in the last 72 hours. Thyroid Function Tests No results for input(s): TSH, T4TOTAL, T3FREE, THYROIDAB in the last 72 hours.  Invalid input(s): FREET3 _____________  DG Chest 2 View  Result Date: 05/07/2021 CLINICAL DATA:  Pre op  Severe aortic stenosis EXAM: CHEST - 2 VIEW COMPARISON:  CT 04/22/2021  FINDINGS: Lungs are clear. The small left lower lobe and right middle lobe nodules seen on CT are less conspicuous. Heart size and mediastinal contours are within normal limits. Aortic Atherosclerosis (ICD10-170.0). No effusion. Visualized bones unremarkable. IMPRESSION: No acute cardiopulmonary disease. Electronically Signed   By: Lucrezia Europe M.D.   On: 05/07/2021 09:22   CARDIAC CATHETERIZATION  Addendum Date: 04/29/2021     Prox RCA lesion is 30% stenosed.   Prox LAD lesion is 40% stenosed.   1st Diag lesion is 70% stenosed.   2nd Diag lesion is 90% stenosed.   1st Mrg lesion is 100% stenosed.   There is severe aortic valve stenosis. Patent left main, LAD, dominant RCA, and large intermediate branches with mild nonobstructive disease Severe diagonal stenosis, small to moderate caliber vessels Chronic occlusion of a small OM1 branch Known critical aortic stenosis with heavy calcification and restriction of the aortic valve on fluoroscopy Normal right heart pressures Plan: medical therapy for CAD, continue TAVR evaluation  Result Date: 04/29/2021   Prox RCA lesion is 30% stenosed.   Prox LAD lesion is 40% stenosed.   1st Diag lesion is 70% stenosed.   2nd Diag lesion is 90% stenosed.   1st Mrg lesion is 100% stenosed.   There is severe aortic valve stenosis. Patent left main, LAD, dominant RCA, and large intermediate branches with mild nonobstructive disease Severe diagonal stenosis, small to moderate caliber vessels  Chronic occlusion of a small OM1 branch Known critical aortic stenosis with heavy calcification and restriction of the aortic valve on fluoroscopy Normal right heart pressures Plan: medical therapy for CAD, continue TAVR evaluation   CT CORONARY MORPH W/CTA COR W/SCORE W/CA W/CM &/OR WO/CM  Addendum Date: 04/22/2021   ADDENDUM REPORT: 04/22/2021 19:23 CLINICAL DATA:  32M with severe aortic stenosis being evaluated for a TAVR procedure. EXAM: Cardiac TAVR CT TECHNIQUE: The patient was scanned on a [employer]. A 120 kV retrospective scan was triggered in the descending thoracic aorta at 111 HU's. Gantry rotation speed was 250 msecs and collimation was .6 mm. No beta blockade or nitro were given. The 3D data set was reconstructed in 5% intervals of the R-R cycle. Systolic and diastolic phases were analyzed on a dedicated work station using MPR, MIP and VRT modes. The patient received 80 cc of contrast. FINDINGS: Aortic Root: Aortic valve: Tricuspid Aortic valve calcium score: 4635 Aortic annulus: Diameter: 53mm x 44mm Perimeter: 74mm Area: 503 mm^2 Calcifications: Moderate calcification adjacent to noncoronary cusp Coronary height: Min Left - 86mm, Max Left - 82mm; Min Right - 74mm Sinotubular height: Left cusp - 45mm; Right cusp - 71mm; Noncoronary cusp - 30mm LVOT (as measured 3 mm below the annulus): Diameter: 33mm x 23mm Area: 463 mm^2 Calcifications: Calcification inferior to noncoronary cusp Aortic sinus width: Left cusp - 70mm; Right cusp - 74mm; Noncoronary cusp - 28mm Sinotubular junction width: 78mm x 20mm Optimum Fluoroscopic Angle for Delivery: LAO 4 CAU 24 Cardiac: Right atrium: Mild enlargement Right ventricle: Mild enlargement Pulmonary arteries: Normal size Pulmonary veins: Normal configuration Left atrium: Mild enlargement Left ventricle: Normal size Pericardium: Normal thickness Coronary arteries: Coronary calcium score 389 IMPRESSION: 1. Trileafet aortic valve with severe  calcifications (AV calcium score 4635) 2. Aortic annulus measures 76mm x 31mm in diameter with perimeter 42mm and area 503 mm^2. Moderate annular calcification adjacent to noncoronary cusp and extending into LVOT. Annular measurements suitable for delivery of 24mm Edwards Sapien 3 valve 3. Sufficient coronary to annulus distance, measuring 44mm  to left main and 87mm to RCA 4. Optimum Fluoroscopic Angle for Delivery:  LAO 4 CAU 24 5. Coronary calcium score 389 Electronically Signed   By: Oswaldo Milian M.D.   On: 04/22/2021 19:23   Result Date: 04/22/2021 EXAM: OVER-READ INTERPRETATION  CT CHEST The following report is an over-read performed by radiologist Dr. Salvatore Marvel of [facility], [place] on 04/22/2021. This over-read does not include interpretation of cardiac or coronary anatomy or pathology. The coronary CTA interpretation by the cardiologist is attached. COMPARISON:  None. FINDINGS: Please see the separate concurrent chest CT angiogram report for details. IMPRESSION: Please see the separate concurrent chest CT angiogram report for details. Electronically Signed: By: Ilona Sorrel M.D. On: 04/22/2021 14:29   DG CHEST PORT 1 VIEW  Result Date: 05/11/2021 CLINICAL DATA:  Central line placement EXAM: PORTABLE CHEST 1 VIEW COMPARISON:  05/07/2021 FINDINGS: Right IJ central line tip overlies SVC. Minimal atelectasis/scarring at the left lung base. No pleural effusion. No pneumothorax. Normal heart size. Post TAVR. IMPRESSION: Right IJ central line tip overlies SVC.  No pneumothorax. Minimal atelectasis/scarring at the left lung base. Electronically Signed   By: Macy Mis M.D.   On: 05/11/2021 12:54   CT ANGIO CHEST AORTA W/CM & OR WO/CM  Result Date: 04/22/2021 CLINICAL DATA:  Aortic valve replacement (TAVR), pre-op eval. Severe aortic stenosis. EXAM: CT ANGIOGRAPHY CHEST, ABDOMEN AND PELVIS TECHNIQUE: Multidetector CT imaging through the chest, abdomen and pelvis was performed using the  standard protocol during bolus administration of intravenous contrast. Multiplanar reconstructed images and MIPs were obtained and reviewed to evaluate the vascular anatomy. CONTRAST:  42mL OMNIPAQUE IOHEXOL 350 MG/ML SOLN COMPARISON:  12/06/2016 Fluciclovine PET-CT. FINDINGS: CTA CHEST FINDINGS Cardiovascular: Top-normal heart size. Coarse calcification and diffuse thickening of the aortic valve. No significant pericardial effusion/thickening. Left anterior descending and right coronary atherosclerosis. Atherosclerotic nonaneurysmal thoracic aorta. Normal caliber pulmonary arteries. No central pulmonary emboli. Mediastinum/Nodes: No discrete thyroid nodules. Unremarkable esophagus. No axillary adenopathy. Enlarged 1.0 cm short axis diameter AP window node (series 11/image 56). Enlarged 1.0 cm right hilar node (series 11/image 60). Enlarged 1.1 cm left hilar node (series 11/image 70). These mildly enlarged thoracic nodes are unchanged from 12/06/2016 PET-CT. Lungs/Pleura: No pneumothorax. No pleural effusion. No acute consolidative airspace disease or lung masses. Solid posterior left lower lobe pulmonary nodules, largest 5 mm (series 12/image 77), for which comparison to 12/06/2016 PET-CT cannot be made due to hypoventilation on prior PET-CT. Moderate patchy subpleural reticulation and ground-glass opacity throughout both lungs with a mild basilar predominance. Musculoskeletal:  No aggressive appearing focal osseous lesions. CTA ABDOMEN AND PELVIS FINDINGS Hepatobiliary: Normal liver with no liver mass. Cholecystectomy. No biliary ductal dilatation. Pancreas: Normal, with no mass or duct dilation. Spleen: Normal size spleen. Hypodense 1.5 cm central splenic lesion (series 11/image 111) appears stable since 12/06/2016 head CT, considered benign. Adrenals/Urinary Tract: Normal adrenals. No contour deforming renal masses. No hydronephrosis. Chronic diffuse bladder wall thickening. Bladder is nondistended.  Stomach/Bowel: Normal non-distended stomach. Normal caliber small bowel with no small bowel wall thickening. Normal appendix. Marked diffuse colonic diverticulosis, most prominent in the sigmoid colon, with no large bowel wall thickening or significant pericolonic fat stranding. Vascular/Lymphatic: Atherosclerotic nonaneurysmal abdominal aorta. No pathologically enlarged lymph nodes in the abdomen or pelvis. Reproductive: Moderate to marked prostatomegaly. Fiducial markers scattered in the prostate. Other: No pneumoperitoneum, ascites or focal fluid collection. Musculoskeletal: No aggressive appearing focal osseous lesions. VASCULAR MEASUREMENTS PERTINENT TO TAVR: AORTA: Minimal Aortic Diameter-16.5 x 16.2  mm Severity of Aortic Calcification-moderate to severe RIGHT PELVIS: Right Common Iliac Artery - Minimal Diameter-11.5 x 9.8 mm Tortuosity-mild Calcification-moderate Right External Iliac Artery - Minimal Diameter-8.8 x 8.4 mm Tortuosity-moderate Calcification-mild Right Common Femoral Artery - Minimal Diameter-9.2 x 8.6 mm Tortuosity-mild Calcification-moderate LEFT PELVIS: Left Common Iliac Artery - Minimal Diameter-11.4 x 9.4 mm Tortuosity-mild Calcification-mild Left External Iliac Artery - Minimal Diameter-9.9 x 9.4 mm Tortuosity-mild-to-moderate Calcification-mild Left Common Femoral Artery - Minimal Diameter-10.0 x 9.2 mm Tortuosity-mild Calcification-moderate Review of the MIP images confirms the above findings. IMPRESSION: 1. Vascular findings and measurements pertinent to potential TAVR procedure, as detailed. 2. Diffuse thickening and coarse calcification of the aortic valve, compatible with the reported clinical history of severe aortic stenosis. 3. Two-vessel coronary atherosclerosis. 4. Moderate patchy subpleural reticulation and ground-glass opacity throughout both lungs with a mild basilar predominance, nonspecific, suggestive of fibrotic interstitial lung disease, with UIP not excluded.  Pulmonology consultation and high-resolution chest CT evaluation may be obtained as clinically warranted. 5. Solid posterior left lower lobe pulmonary nodules, largest 5 mm. Follow-up chest CT suggested in 3-6 months given history of prostate cancer. 6. Mild mediastinal and bilateral hilar lymphadenopathy is stable since 12/06/2016 PET-CT, nonspecific, favor reactive. 7. Marked diffuse colonic diverticulosis. 8. Chronic diffuse bladder wall thickening, probably due to chronic bladder outlet obstruction by the enlarged prostate. 9. Aortic Atherosclerosis (ICD10-I70.0). Electronically Signed   By: Ilona Sorrel M.D.   On: 04/22/2021 14:56   ECHOCARDIOGRAM COMPLETE  Result Date: 04/12/2021    ECHOCARDIOGRAM REPORT   Patient Name:   Rodney Patterson Date of Exam: 04/12/2021 Medical Rec #:  [id number]     Height:       71.0 in Accession #:    [id number]    Weight:       173.2 lb Date of Birth:  Dec 01, 1932    BSA:          1.983 m Patient Age:    64 years      BP:           152/84 mmHg Patient Gender: M             HR:           64 bpm. Exam Location:  [place] Procedure: 2D Echo, 3D Echo, Cardiac Doppler, Color Doppler and Strain Analysis Indications:    I35 Aortic stenosis  History:        Patient has no prior history of Echocardiogram examinations.                 Signs/Symptoms:Shortness of Breath; Risk Factors:Former Smoker                 and Hypertension. Prostate cancer. Anemia. CKD stage 3.  Sonographer:    Basilia Jumbo BS, RDCS Referring Phys: [place]  1. There is severe aortic stenosis with AVA 0.96cm2, mean gradient 75mmHg, peak gradient 111mmHg, Vmax 5.59m/s, DI 0.24 (LVOT VTI 31).  2. Left ventricular ejection fraction, by estimation, is 60 to 65%. The left ventricle has normal function. The left ventricle has no regional wall motion abnormalities. There is mild concentric left ventricular hypertrophy. Left ventricular diastolic parameters are consistent with Grade I diastolic  dysfunction (impaired relaxation). Elevated left atrial pressure. The average left ventricular global longitudinal strain is -21.7 %. The global longitudinal strain is normal.  3. Right ventricular systolic function is normal. The right ventricular size is normal.  4. Left atrial size was moderately dilated.  5. The  mitral valve is abnormal. Trivial mitral valve regurgitation. Moderate mitral annular calcification.  6. The aortic valve was not well visualized. There is severe calcifcation of the aortic valve. There is severe thickening of the aortic valve. Aortic valve regurgitation is mild. Severe aortic valve stenosis.  7. The inferior vena cava is normal in size with greater than 50% respiratory variability, suggesting right atrial pressure of 3 mmHg. Comparison(s): No prior Echocardiogram. FINDINGS  Left Ventricle: Left ventricular ejection fraction, by estimation, is 60 to 65%. The left ventricle has normal function. The left ventricle has no regional wall motion abnormalities. The average left ventricular global longitudinal strain is -21.7 %. The global longitudinal strain is normal. 3D left ventricular ejection fraction analysis performed but not reported based on interpreter judgement due to suboptimal tracking. The left ventricular internal cavity size was normal in size. There is mild concentric left ventricular hypertrophy. Left ventricular diastolic parameters are consistent with Grade I diastolic dysfunction (impaired relaxation). Elevated left atrial pressure. Right Ventricle: The right ventricular size is normal. No increase in right ventricular wall thickness. Right ventricular systolic function is normal. Left Atrium: Left atrial size was moderately dilated. Right Atrium: Right atrial size was normal in size. Pericardium: There is no evidence of pericardial effusion. Mitral Valve: The mitral valve is abnormal. There is moderate thickening of the mitral valve leaflet(s). Moderate mitral annular  calcification. Trivial mitral valve regurgitation. MV peak gradient, 8.9 mmHg. The mean mitral valve gradient is 3.0 mmHg. Tricuspid Valve: The tricuspid valve is normal in structure. Tricuspid valve regurgitation is trivial. Aortic Valve: Mean gradient 36mmHg, peak 120 mmHg, Vmax 5.5 m/s, DI 0.22, AVA 0.93. The aortic valve was not well visualized. There is severe calcifcation of the aortic valve. There is severe thickening of the aortic valve. Aortic valve regurgitation is mild. Aortic regurgitation PHT measures 504 msec. Severe aortic stenosis is present. Aortic valve mean gradient measures 69.0 mmHg. Aortic valve peak gradient measures 119.7 mmHg. Aortic valve area, by VTI measures 1.09 cm. Pulmonic Valve: The pulmonic valve was normal in structure. Pulmonic valve regurgitation is trivial. Aorta: The aortic root and ascending aorta are structurally normal, with no evidence of dilitation. Venous: The inferior vena cava is normal in size with greater than 50% respiratory variability, suggesting right atrial pressure of 3 mmHg. IAS/Shunts: No atrial level shunt detected by color flow Doppler.  LEFT VENTRICLE PLAX 2D LVIDd:         4.70 cm   Diastology LVIDs:         3.15 cm   LV e' medial:    5.33 cm/s LV PW:         0.90 cm   LV E/e' medial:  18.5 LV IVS:        1.10 cm   LV e' lateral:   6.75 cm/s LVOT diam:     2.37 cm   LV E/e' lateral: 14.6 LV SV:         141 LV SV Index:   71        2D Longitudinal Strain LVOT Area:     4.42 cm  2D Strain GLS (A2C):   -22.1 %                          2D Strain GLS (A3C):   -19.8 %  2D Strain GLS (A4C):   -23.1 %                          2D Strain GLS Avg:     -21.7 %                           3D Volume EF:                          3D EF:        56 %                          LV EDV:       144 ml                          LV ESV:       64 ml                          LV SV:        80 ml RIGHT VENTRICLE             IVC RV Basal diam:  3.20 cm     IVC  diam: 1.60 cm RV S prime:     13.50 cm/s TAPSE (M-mode): 2.1 cm LEFT ATRIUM             Index        RIGHT ATRIUM           Index LA diam:        4.50 cm 2.27 cm/m   RA Pressure: 3.00 mmHg LA Vol (A2C):   80.3 ml 40.48 ml/m  RA Area:     12.80 cm LA Vol (A4C):   63.9 ml 32.22 ml/m  RA Volume:   25.80 ml  13.01 ml/m LA Biplane Vol: 71.2 ml 35.90 ml/m  AORTIC VALVE AV Area (Vmax):    1.04 cm AV Area (Vmean):   1.11 cm AV Area (VTI):     1.09 cm AV Vmax:           547.00 cm/s AV Vmean:          358.000 cm/s AV VTI:            1.290 m AV Peak Grad:      119.7 mmHg AV Mean Grad:      69.0 mmHg LVOT Vmax:         128.33 cm/s LVOT Vmean:        90.100 cm/s LVOT VTI:          0.318 m LVOT/AV VTI ratio: 0.25 AI PHT:            504 msec  AORTA Ao Root diam: 3.50 cm Ao Asc diam:  3.60 cm MITRAL VALVE                TRICUSPID VALVE MV Area (PHT): 2.03 cm     Estimated RAP:  3.00 mmHg MV Peak grad:  8.9 mmHg MV Mean grad:  3.0 mmHg     SHUNTS MV Vmax:       1.49 m/s     Systemic VTI:  0.32 m MV Vmean:      74.3 cm/s    Systemic Diam: 2.37 cm MV Decel Time:  373 msec MV E velocity: 98.50 cm/s MV A velocity: 148.00 cm/s MV E/A ratio:  0.67 Gwyndolyn Kaufman MD Electronically signed by Gwyndolyn Kaufman MD Signature Date/Time: 04/12/2021/1:58:40 PM    Final    ECHOCARDIOGRAM LIMITED  Result Date: 05/11/2021    ECHOCARDIOGRAM LIMITED REPORT   Patient Name:   Rodney Patterson Date of Exam: 05/11/2021 Medical Rec #:  [id number]       Height:       70.0 in Accession #:    [id number]      Weight:       180.4 lb Date of Birth:  1932/11/02      BSA:          1.998 m Patient Age:    9 years        BP:           176/92 mmHg Patient Gender: M               HR:           97 bpm. Exam Location:  Inpatient Procedure: Limited Echo, Cardiac Doppler and Color Doppler Indications:     I35.0 Nonrheumatic aortic (valve) stenosis  History:         Patient has prior history of Echocardiogram examinations, most                  recent  04/12/2021. Aortic Valve Disease; Risk                  Factors:Hypertension and Dyslipidemia. Severe aortic stenosis.                  Cancer.                  Aortic Valve: 26 mm Sapien prosthetic, stented (TAVR) valve is                  present in the aortic position. Procedure Date: 05/11/2021.  Sonographer:     Roseanna Rainbow RDCS Referring Phys:  [place] Diagnosing Phys: Sanda Klein MD  Sonographer Comments: TAVR procedure.  PREPROCEDURE FINDINGS Normal left ventricular systolic function (LVEF 35-46%) and normal regional wall motion. Trileaflet aortic valve with severe calcific aortic stenosis. Peak gradient 97 mm Hg, mean gradient 62 mm Hg, dimensionless index 0.19, acceleration time 131 ms, calculated AV area 0.5 cm sq (0.25 cm sq/m sq indexed for BSA). Mild aortic insufficiency. Mild mitral insufficiency. No pericardial effusion. POSTPROCEDURE FINDINGS Hyperdynamic left ventricular systolic function (LVEF 56%) and normal regional wall motion. Well seated 23 mm S3U TAVR stent valve. A trivial perivalvular leaks is seen. Peak gradient 9 mm Hg, mean gradient 5 mm Hg, dimensionless index 0.69, acceleration time 93 ms, calculated AV area 2.17 cm sq (1.09 cm sq/m sq indexed for BSA). No central aortic insufficiency. Trivial mitral insufficiency. No pericardial effusion.  IMPRESSIONS  1. Left ventricular ejection fraction, by estimation, is 65 to 70%. The left ventricle has normal function. The left ventricle has no regional wall motion abnormalities. There is mild concentric left ventricular hypertrophy.  2. Right ventricular systolic function is normal.  3. Left atrial size was moderately dilated.  4. The mitral valve is normal in structure. Mild mitral valve regurgitation.  5. The aortic valve has been repaired/replaced. There is severe calcifcation of the aortic valve. There is severe thickening of the aortic valve. Aortic valve regurgitation is mild. Severe aortic valve stenosis. There is a 26 mm  Sapien prosthetic (TAVR)  valve present in the aortic position. Procedure Date: 05/11/2021. FINDINGS  Left Ventricle: Left ventricular ejection fraction, by estimation, is 65 to 70%. The left ventricle has normal function. The left ventricle has no regional wall motion abnormalities. There is mild concentric left ventricular hypertrophy. Right Ventricle: Right ventricular systolic function is normal. Left Atrium: Left atrial size was moderately dilated. Right Atrium: Right atrial size was normal in size. Pericardium: There is no evidence of pericardial effusion. Mitral Valve: The mitral valve is normal in structure. Mild to moderate mitral annular calcification. Mild mitral valve regurgitation. Tricuspid Valve: The tricuspid valve is normal in structure. Tricuspid valve regurgitation is not demonstrated. Aortic Valve: The aortic valve has been repaired/replaced. There is severe calcifcation of the aortic valve. There is severe thickening of the aortic valve. Aortic valve regurgitation is mild. Severe aortic stenosis is present. Aortic valve mean gradient  measures 5.0 mmHg. Aortic valve peak gradient measures 9.0 mmHg. Aortic valve area, by VTI measures 2.17 cm. There is a 26 mm Sapien prosthetic, stented (TAVR) valve present in the aortic position. Procedure Date: 05/11/2021. Pulmonic Valve: The pulmonic valve was not well visualized. Pulmonic valve regurgitation is not visualized. Aorta: The aortic root is normal in size and structure. IAS/Shunts: The interatrial septum was not assessed. LEFT VENTRICLE PLAX 2D LVOT diam:     2.00 cm LV SV:         85 LV SV Index:   43 LVOT Area:     3.14 cm  LV Volumes (MOD) LV vol d, MOD A2C: 67.2 ml LV vol d, MOD A4C: 105.0 ml LV vol s, MOD A2C: 16.9 ml LV vol s, MOD A4C: 24.3 ml LV SV MOD A2C:     50.3 ml LV SV MOD A4C:     105.0 ml LV SV MOD BP:      64.1 ml AORTIC VALVE AV Area (Vmax):    2.41 cm AV Area (Vmean):   2.24 cm AV Area (VTI):     2.17 cm AV Vmax:            150.00 cm/s AV Vmean:          99.700 cm/s AV VTI:            0.393 m AV Peak Grad:      9.0 mmHg AV Mean Grad:      5.0 mmHg LVOT Vmax:         115.00 cm/s LVOT Vmean:        71.000 cm/s LVOT VTI:          0.272 m LVOT/AV VTI ratio: 0.69  SHUNTS Systemic VTI:  0.27 m Systemic Diam: 2.00 cm Sanda Klein MD Electronically signed by Sanda Klein MD Signature Date/Time: 05/11/2021/4:11:30 PM    Final    Structural Heart Procedure  Result Date: 05/11/2021 See surgical note for result.  CT ANGIO ABDOMEN PELVIS  W &/OR WO CONTRAST  Result Date: 04/22/2021 CLINICAL DATA:  Aortic valve replacement (TAVR), pre-op eval. Severe aortic stenosis. EXAM: CT ANGIOGRAPHY CHEST, ABDOMEN AND PELVIS TECHNIQUE: Multidetector CT imaging through the chest, abdomen and pelvis was performed using the standard protocol during bolus administration of intravenous contrast. Multiplanar reconstructed images and MIPs were obtained and reviewed to evaluate the vascular anatomy. CONTRAST:  67mL OMNIPAQUE IOHEXOL 350 MG/ML SOLN COMPARISON:  12/06/2016 Fluciclovine PET-CT. FINDINGS: CTA CHEST FINDINGS Cardiovascular: Top-normal heart size. Coarse calcification and diffuse thickening of the aortic valve. No significant pericardial effusion/thickening. Left anterior descending and right coronary  atherosclerosis. Atherosclerotic nonaneurysmal thoracic aorta. Normal caliber pulmonary arteries. No central pulmonary emboli. Mediastinum/Nodes: No discrete thyroid nodules. Unremarkable esophagus. No axillary adenopathy. Enlarged 1.0 cm short axis diameter AP window node (series 11/image 56). Enlarged 1.0 cm right hilar node (series 11/image 60). Enlarged 1.1 cm left hilar node (series 11/image 70). These mildly enlarged thoracic nodes are unchanged from 12/06/2016 PET-CT. Lungs/Pleura: No pneumothorax. No pleural effusion. No acute consolidative airspace disease or lung masses. Solid posterior left lower lobe pulmonary nodules, largest 5 mm  (series 12/image 77), for which comparison to 12/06/2016 PET-CT cannot be made due to hypoventilation on prior PET-CT. Moderate patchy subpleural reticulation and ground-glass opacity throughout both lungs with a mild basilar predominance. Musculoskeletal:  No aggressive appearing focal osseous lesions. CTA ABDOMEN AND PELVIS FINDINGS Hepatobiliary: Normal liver with no liver mass. Cholecystectomy. No biliary ductal dilatation. Pancreas: Normal, with no mass or duct dilation. Spleen: Normal size spleen. Hypodense 1.5 cm central splenic lesion (series 11/image 111) appears stable since 12/06/2016 head CT, considered benign. Adrenals/Urinary Tract: Normal adrenals. No contour deforming renal masses. No hydronephrosis. Chronic diffuse bladder wall thickening. Bladder is nondistended. Stomach/Bowel: Normal non-distended stomach. Normal caliber small bowel with no small bowel wall thickening. Normal appendix. Marked diffuse colonic diverticulosis, most prominent in the sigmoid colon, with no large bowel wall thickening or significant pericolonic fat stranding. Vascular/Lymphatic: Atherosclerotic nonaneurysmal abdominal aorta. No pathologically enlarged lymph nodes in the abdomen or pelvis. Reproductive: Moderate to marked prostatomegaly. Fiducial markers scattered in the prostate. Other: No pneumoperitoneum, ascites or focal fluid collection. Musculoskeletal: No aggressive appearing focal osseous lesions. VASCULAR MEASUREMENTS PERTINENT TO TAVR: AORTA: Minimal Aortic Diameter-16.5 x 16.2 mm Severity of Aortic Calcification-moderate to severe RIGHT PELVIS: Right Common Iliac Artery - Minimal Diameter-11.5 x 9.8 mm Tortuosity-mild Calcification-moderate Right External Iliac Artery - Minimal Diameter-8.8 x 8.4 mm Tortuosity-moderate Calcification-mild Right Common Femoral Artery - Minimal Diameter-9.2 x 8.6 mm Tortuosity-mild Calcification-moderate LEFT PELVIS: Left Common Iliac Artery - Minimal Diameter-11.4 x 9.4 mm  Tortuosity-mild Calcification-mild Left External Iliac Artery - Minimal Diameter-9.9 x 9.4 mm Tortuosity-mild-to-moderate Calcification-mild Left Common Femoral Artery - Minimal Diameter-10.0 x 9.2 mm Tortuosity-mild Calcification-moderate Review of the MIP images confirms the above findings. IMPRESSION: 1. Vascular findings and measurements pertinent to potential TAVR procedure, as detailed. 2. Diffuse thickening and coarse calcification of the aortic valve, compatible with the reported clinical history of severe aortic stenosis. 3. Two-vessel coronary atherosclerosis. 4. Moderate patchy subpleural reticulation and ground-glass opacity throughout both lungs with a mild basilar predominance, nonspecific, suggestive of fibrotic interstitial lung disease, with UIP not excluded. Pulmonology consultation and high-resolution chest CT evaluation may be obtained as clinically warranted. 5. Solid posterior left lower lobe pulmonary nodules, largest 5 mm. Follow-up chest CT suggested in 3-6 months given history of prostate cancer. 6. Mild mediastinal and bilateral hilar lymphadenopathy is stable since 12/06/2016 PET-CT, nonspecific, favor reactive. 7. Marked diffuse colonic diverticulosis. 8. Chronic diffuse bladder wall thickening, probably due to chronic bladder outlet obstruction by the enlarged prostate. 9. Aortic Atherosclerosis (ICD10-I70.0). Electronically Signed   By: Ilona Sorrel M.D.   On: 04/22/2021 14:56   Disposition   Pt is being discharged home today in good condition.  Follow-up Plans & Appointments     Follow-up Information     Eileen Stanford, PA-C. Go on 05/19/2021.   Specialties: Cardiology, Radiology Why: @ 3:30pm, please arrive at least 10 minutes early. Contact information: [address] [place] [address] [phone]  Discharge Medications   Allergies as of 05/12/2021       Reactions   Honey Bee Venom [bee Venom] Swelling    Adhesive [tape] Other (See Comments)   "peels skin off" use paper tape   Rosuvastatin Calcium    legs cramps   Tetanus Immune Globulin Swelling   Swollen arm        Medication List     TAKE these medications    acetaminophen 500 MG tablet Commonly known as: TYLENOL Take 500 mg by mouth every 6 (six) hours as needed for moderate pain.   amLODipine 5 MG tablet Commonly known as: NORVASC Take 5 mg by mouth daily.   aspirin 81 MG tablet Take 1 tablet (81 mg total) by mouth daily. What changed: when to take this   betamethasone dipropionate 0.05 % cream Apply 1 application topically 2 (two) times daily as needed (itching/rash).   Citrucel oral powder Generic drug: methylcellulose Take 2-3 teaspoonful oral daily   dorzolamide 2 % ophthalmic solution Commonly known as: TRUSOPT Place 1 drop into both eyes 2 (two) times daily.   loperamide 2 MG capsule Commonly known as: IMODIUM Take 2 mg by mouth daily as needed for diarrhea or loose stools.   Omega-3 1000 MG Caps Take 1,000 mg by mouth daily.   OVER THE COUNTER MEDICATION Take 1 tablet by mouth daily. [employer]   pravastatin 20 MG tablet Commonly known as: PRAVACHOL Take 20 mg by mouth every other day.   Systane Complete 0.6 % Soln Generic drug: Propylene Glycol Place 1 drop into both eyes daily.   vitamin C 1000 MG tablet Take 1,000 mg by mouth daily.   Vitamin D3 125 MCG (5000 UT) Caps Take 5,000 Units by mouth every morning.            Outstanding Labs/Studies   none  Duration of Discharge Encounter   Greater than 30 minutes including physician time.  SignedAngelena Form, PA-C 05/12/2021, 9:45 AM [phone]  Patient seen, examined. Available data reviewed. Agree with findings, assessment, and plan as outlined by Nell Range, PA-C.  The patient is interviewed and examined this morning.  He is alert, oriented, in no distress.  HEENT is normal, lungs are clear bilaterally, heart  is regular rate and rhythm with no murmur or gallop, distant heart sounds noted, JVP is normal, abdomen is soft and nontender, extremities have no edema, bilateral groin sites are clear.  Echo images are personally reviewed.  LV function is vigorous, RV function is normal, TAVR prosthetic function is normal with no paravalvular regurgitation and a mean gradient of 11 mmHg.  Postoperative instructions were reviewed with the patient in detail.  He is clinically stable for discharge today.  Telemetry has been reviewed and he has no significant arrhythmia.  [name], M.D. 05/12/2021 10:03 AM

## 2021-05-13 ENCOUNTER — Telehealth: Payer: Self-pay | Admitting: Physician Assistant

## 2021-05-13 NOTE — Telephone Encounter (Signed)
°  [place] VALVE TEAM   Patient contacted regarding discharge from [facility] on 05/12/21  Patient understands to follow up with a structural heart APP on 1/18 at [place].  Patient understands discharge instructions? yes Patient understands medications and regimen? yes Patient understands to bring all medications to this visit? yes  Angelena Form PA-C  [facility]

## 2021-05-17 LAB — POCT I-STAT 7, (LYTES, BLD GAS, ICA,H+H)
Acid-base deficit: 1 mmol/L (ref 0.0–2.0)
Acid-base deficit: 3 mmol/L — ABNORMAL HIGH (ref 0.0–2.0)
Bicarbonate: 24 mmol/L (ref 20.0–28.0)
Bicarbonate: 23 mmol/L (ref 20.0–28.0)
Calcium, Ion: 1.26 mmol/L (ref 1.15–1.40)
Calcium, Ion: 1.27 mmol/L (ref 1.15–1.40)
HCT: 36 % — ABNORMAL LOW (ref 39.0–52.0)
HCT: 34 % — ABNORMAL LOW (ref 39.0–52.0)
Hemoglobin: 12.2 g/dL — ABNORMAL LOW (ref 13.0–17.0)
Hemoglobin: 11.6 g/dL — ABNORMAL LOW (ref 13.0–17.0)
O2 Saturation: 100 %
O2 Saturation: 96 %
Potassium: 4.1 mmol/L (ref 3.5–5.1)
Potassium: 4.4 mmol/L (ref 3.5–5.1)
Sodium: 139 mmol/L (ref 135–145)
Sodium: 138 mmol/L (ref 135–145)
TCO2: 25 mmol/L (ref 22–32)
TCO2: 24 mmol/L (ref 22–32)
pCO2 arterial: 42.1 mmHg (ref 32.0–48.0)
pCO2 arterial: 43.1 mmHg (ref 32.0–48.0)
pH, Arterial: 7.364 (ref 7.350–7.450)
pH, Arterial: 7.335 — ABNORMAL LOW (ref 7.350–7.450)
pO2, Arterial: 247 mmHg — ABNORMAL HIGH (ref 83.0–108.0)
pO2, Arterial: 89 mmHg (ref 83.0–108.0)

## 2021-05-17 LAB — POCT I-STAT, CHEM 8
BUN: 19 mg/dL (ref 8–23)
Calcium, Ion: 1.28 mmol/L (ref 1.15–1.40)
Chloride: 104 mmol/L (ref 98–111)
Creatinine, Ser: 1.5 mg/dL — ABNORMAL HIGH (ref 0.61–1.24)
Glucose, Bld: 162 mg/dL — ABNORMAL HIGH (ref 70–99)
HCT: 34 % — ABNORMAL LOW (ref 39.0–52.0)
Hemoglobin: 11.6 g/dL — ABNORMAL LOW (ref 13.0–17.0)
Potassium: 4.1 mmol/L (ref 3.5–5.1)
Sodium: 138 mmol/L (ref 135–145)
TCO2: 25 mmol/L (ref 22–32)

## 2021-05-19 ENCOUNTER — Encounter: Payer: Self-pay | Admitting: Physician Assistant

## 2021-05-19 ENCOUNTER — Other Ambulatory Visit: Payer: Self-pay

## 2021-05-19 ENCOUNTER — Ambulatory Visit (INDEPENDENT_AMBULATORY_CARE_PROVIDER_SITE_OTHER): Payer: Medicare Other | Admitting: Physician Assistant

## 2021-05-19 VITALS — BP 154/80 | HR 57 | Ht 71.5 in | Wt 180.0 lb

## 2021-05-19 DIAGNOSIS — I10 Essential (primary) hypertension: Secondary | ICD-10-CM

## 2021-05-19 DIAGNOSIS — N529 Male erectile dysfunction, unspecified: Secondary | ICD-10-CM

## 2021-05-19 DIAGNOSIS — R911 Solitary pulmonary nodule: Secondary | ICD-10-CM

## 2021-05-19 DIAGNOSIS — R9389 Abnormal findings on diagnostic imaging of other specified body structures: Secondary | ICD-10-CM | POA: Diagnosis not present

## 2021-05-19 DIAGNOSIS — Z952 Presence of prosthetic heart valve: Secondary | ICD-10-CM | POA: Diagnosis not present

## 2021-05-19 MED ORDER — TADALAFIL 20 MG PO TABS: 20.0000 mg | ORAL_TABLET | Freq: Every day | ORAL | 6 refills | Status: AC | PRN

## 2021-05-19 MED ORDER — AMLODIPINE BESYLATE 10 MG PO TABS: 10.0000 mg | ORAL_TABLET | Freq: Every day | ORAL | 3 refills | Status: DC

## 2021-05-19 MED ORDER — AMOXICILLIN 500 MG PO TABS: 2000.0000 mg | ORAL_TABLET | ORAL | 12 refills | Status: DC

## 2021-05-19 NOTE — Progress Notes (Signed)
[place]                                     Cardiology Office Note:    Date:  05/19/2021   ID:  Rodney Patterson, DOB [birth year], MRN [id number]  PCP:  Lavone Orn, MD  [facility] Cardiologist:  [name], MD  [facility] Electrophysiologist:  None   Referring MD: Lavone Orn, MD   [facility] s/p TAVR  History of Present Illness:    Rodney Patterson is a 88 y.o. male with a hx of prostate CA s/p seed XRT, HLD, HTN, CKD stage II, CAD, and critical AS s/p TAVR (05/11/21) who presents to clinic for follow up.   The patient has been healthy and active. He developed exertional fatigue and dyspnea. He had a murmur on exam prompting an echo by his PCP. Echo 04/12/21 showed EF 60-65% with critical AS with a mean gradient of 69 mm hg, peak 120 mm hg, AVA 0.93 cm2, DVI 0.22, mild AI. [facility] 04/29/21 showed patent left main, LAD, dominant RCA, and large intermediate branches with mild nonobstructive disease as well as a severe diagonal stenosis, small to moderate caliber vessels and chronic occlusion of a small OM1 branch. Medical therapy was recommended.   He was evaluated by the multidisciplinary valve team and underwent successful TAVR with a 26 mm Edwards Sapien 3 Ultra Resilia THV via the TF approach on 05/11/21. Post operative echo showed EF >75%, normally functioning TAVR with a mean gradient of 11 mmHg and trivial PVL. He was discharged on aspirin alone.   Today the patient presents to clinic for follow up. Here alone. Doing great. No CP or SOB. No LE edema, orthopnea or PND. No dizziness or syncope. No blood in stool or urine. No palpitations. Anxious to get back to hunting and visit a friend in [place]. Very thankful for our team and the care he received.    Past Medical History:  Diagnosis Date   Bilateral tinnitus    Carotid stenosis, bilateral    per duplex 02-11-2016  bilateral ICA less than50% stenosis   CKD (chronic  kidney disease) stage 3, GFR 30-59 ml/min (HCC)    Coronary artery disease    Dyspnea    Erectile dysfunction    History of hypertension    per pt pcp took him off medication approx. 2016   Hyperlipidemia    Hyperplasia of prostate with lower urinary tract symptoms (LUTS)    Mild acid reflux    watches diet   Nocturia more than twice per night    Postcholecystectomy diarrhea    Prostate cancer [facility]) urologist-  dr wrenn/ oncologist-  dr Tammi Klippel   dx 06/ 2018-- Stage cT2c, Gleason 3+4, PSA10.1, vol 98cc-- plant external beam radiation   S/P TAVR (transcatheter aortic valve replacement) 05/11/2021   s/p TAVR wirh 26 mm Edwards S3UR via the TF approach by Dr. Burt Knack and Dr. Cyndia Bent (TAVR)   Wears glasses    Wears partial dentures    upper   White coat syndrome without diagnosis of hypertension     Past Surgical History:  Procedure Laterality Date   CATARACT EXTRACTION W/ INTRAOCULAR LENS  IMPLANT, BILATERAL  2016 approx.   COLONOSCOPY  last one 02-17-2004   GOLD SEED IMPLANT N/A 12/27/2016   Procedure: GOLD SEED IMPLANT WITH SPACE OAR;  Surgeon: Irine Seal,  MD;  Location: [place];  Service: Urology;  Laterality: N/A;   INTRAOPERATIVE TRANSTHORACIC ECHOCARDIOGRAM Left 05/11/2021   Procedure: INTRAOPERATIVE TRANSTHORACIC ECHOCARDIOGRAM;  Surgeon: [name], MD;  Location: [place];  Service: Open Heart Surgery;  Laterality: Left;   LAPAROSCOPIC CHOLECYSTECTOMY  01-18-2003  dr Margot Chimes   PROSTATE BIOPSY  10-26-2016  dr Jeffie Pollock (office)   RIGHT HEART CATH AND CORONARY ANGIOGRAPHY N/A 04/29/2021   Procedure: RIGHT HEART CATH AND CORONARY ANGIOGRAPHY;  Surgeon: [name], MD;  Location: [place] [facility];  Service: Cardiovascular;  Laterality: N/A;   TRANSCATHETER AORTIC VALVE REPLACEMENT, TRANSFEMORAL Bilateral 05/11/2021   Procedure: TRANSCATHETER AORTIC VALVE REPLACEMENT, TRANSFEMORAL;  Surgeon: [name], MD;  Location: [place];  Service: Open Heart Surgery;   Laterality: Bilateral;   ULTRASOUND GUIDANCE FOR VASCULAR ACCESS Bilateral 05/11/2021   Procedure: ULTRASOUND GUIDANCE FOR VASCULAR ACCESS;  Surgeon: [name], MD;  Location: [place];  Service: Open Heart Surgery;  Laterality: Bilateral;    Current Medications: Current Meds  Medication Sig   acetaminophen (TYLENOL) 500 MG tablet Take 500 mg by mouth every 6 (six) hours as needed for moderate pain.   amLODipine (NORVASC) 10 MG tablet Take 1 tablet (10 mg total) by mouth daily.   amoxicillin (AMOXIL) 500 MG tablet Take 4 tablets (2,000 mg total) by mouth as directed. 1 hour prior to dental work including cleanings   Ascorbic Acid (VITAMIN C) 1000 MG tablet Take 1,000 mg by mouth daily.   aspirin 81 MG tablet Take 1 tablet (81 mg total) by mouth daily.   betamethasone dipropionate 0.05 % cream Apply 1 application topically 2 (two) times daily as needed (itching/rash).   Cholecalciferol (VITAMIN D3) 5000 units CAPS Take 5,000 Units by mouth every morning.   dorzolamide (TRUSOPT) 2 % ophthalmic solution Place 1 drop into both eyes 2 (two) times daily.   loperamide (IMODIUM) 2 MG capsule Take 2 mg by mouth daily as needed for diarrhea or loose stools.   methylcellulose (CITRUCEL) oral powder Take 2-3 teaspoonful oral daily   Omega-3 1000 MG CAPS Take 1,000 mg by mouth daily.   OVER THE COUNTER MEDICATION Take 1 tablet by mouth daily. [employer]   pravastatin (PRAVACHOL) 20 MG tablet Take 20 mg by mouth every other day.   Propylene Glycol (SYSTANE COMPLETE) 0.6 % SOLN Place 1 drop into both eyes daily.   tadalafil (CIALIS) 20 MG tablet Take 1 tablet (20 mg total) by mouth daily as needed for erectile dysfunction.   [DISCONTINUED] amLODipine (NORVASC) 5 MG tablet Take 5 mg by mouth daily.     Allergies:   Honey bee venom [bee venom], Adhesive [tape], Rosuvastatin calcium, and Tetanus immune globulin   Social History   Socioeconomic History   Marital status: Married    Spouse name:  Not on file   Number of children: Not on file   Years of education: Not on file   Highest education level: Not on file  Occupational History   Not on file  Tobacco Use   Smoking status: Former    Types: Cigars, Pipe    Quit date: 05/03/1967    Years since quitting: 54.0   Smokeless tobacco: Never  Vaping Use   Vaping Use: Never used  Substance and Sexual Activity   Alcohol use: Not Currently   Drug use: No   Sexual activity: Not on file  Other Topics Concern   Not on file  Social History Narrative   Not on file  Social Determinants of Health   Financial Resource Strain: Not on file  Food Insecurity: Not on file  Transportation Needs: Not on file  Physical Activity: Not on file  Stress: Not on file  Social Connections: Not on file     Family History: The patient's family history includes Heart attack in his mother. There is no history of Cancer, Esophageal cancer, or Colon cancer.  ROS:   Please see the history of present illness.    All other systems reviewed and are negative.  EKGs/Labs/Other Studies Reviewed:    The following studies were reviewed today:    TAVR OPERATIVE NOTE     Date of Procedure:                05/11/2021   Preoperative Diagnosis:      Severe Aortic Stenosis    Postoperative Diagnosis:    Same    Procedure:        Transcatheter Aortic Valve Replacement - Percutaneous Right Transfemoral Approach             Edwards Sapien 3 Ultra Resilia THV (size 26 mm, model # 9755RSL, serial # [id number])              Co-Surgeons:                        Gaye Pollack, MD and [name], MD    Anesthesiologist:                  Collins Scotland, MD   Echocardiographer:              Bertrum Sol, MD   Pre-operative Echo Findings: Critical aortic stenosis Normal left ventricular systolic function   Post-operative Echo Findings: Trace paravalvular leak Normal left ventricular systolic function   _____________     Echo 05/12/21: IMPRESSIONS      1. Left ventricular ejection fraction, by estimation, is >75%. The left  ventricle has hyperdynamic function. The left ventricle has no regional  wall motion abnormalities. There is mild concentric left ventricular  hypertrophy. Left ventricular diastolic  parameters are consistent with Grade I diastolic dysfunction (impaired  relaxation).   2. Right ventricular systolic function is hyperdynamic. The right  ventricular size is normal. Tricuspid regurgitation signal is inadequate  for assessing PA pressure.   3. Left atrial size was mildly dilated.   4. The mitral valve is normal in structure. No evidence of mitral valve  regurgitation. No evidence of mitral stenosis.   5. There is a trivial perivalvular leak towards the mitral annulus. The  aortic valve has been repaired/replaced. Aortic valve regurgitation is  trivial. There is a 23 mm Ultra, stented (TAVR) valve present in the  aortic position. Procedure Date:  05/11/2021. Aortic valve mean gradient measures 11.0 mmHg. Aortic valve  Vmax measures 2.25 m/s. Aortic valve acceleration time measures 58 msec.   EKG:  EKG is  ordered today.  The ekg ordered today demonstrates sinus brady with HR 57  Recent Labs: 05/07/2021: ALT 19; B Natriuretic Peptide 78.2 05/12/2021: BUN 20; Creatinine, Ser 1.48; Hemoglobin 12.2; Magnesium 1.8; Platelets 140; Potassium 3.9; Sodium 137  Recent Lipid Panel No results found for: CHOL, TRIG, HDL, CHOLHDL, VLDL, LDLCALC, LDLDIRECT   Risk Assessment/Calculations:       Physical Exam:    VS:  BP (!) 154/80    Pulse (!) 57    Ht 5' 11.5" (1.816 m)    Wt 180  lb (81.6 kg)    BMI 24.76 kg/m     Wt Readings from Last 3 Encounters:  05/19/21 180 lb (81.6 kg)  05/12/21 180 lb 12.4 oz (82 kg)  05/07/21 180 lb 6.4 oz (81.8 kg)     GEN:  Well nourished, well developed in no acute distress HEENT: Normal NECK: No JVD LYMPHATICS: No lymphadenopathy CARDIAC: RRR, no murmurs, rubs, gallops RESPIRATORY:  Clear to  auscultation without rales, wheezing or rhonchi  ABDOMEN: Soft, non-tender, non-distended MUSCULOSKELETAL:  No edema; No deformity  SKIN: Warm and dry.  Groin sites clear without hematoma or ecchymosis  NEUROLOGIC:  Alert and oriented x 3 PSYCHIATRIC:  Normal affect   ASSESSMENT:    1. S/P TAVR (transcatheter aortic valve replacement)   2. Essential hypertension   3. Abnormal CT scan, chest   4. Pulmonary nodule   5. Erectile dysfunction, unspecified erectile dysfunction type    PLAN:    In order of problems listed above:  Severe AS s/p TAVR: doing great. Groin sites healing well. ECG with no HAVB. Continue on aspirin alone. SBE prophylaxis discussed; I have RX'd amoxicillin. I will se him back in Feb for 1 month follow up and echo   HTN: BP elevated today. Increase Norvasc from 5mg  to 10mg  and he will keep a log for me   Abnormal lung CT: pre TAVR CTs showed moderate patchy subpleural reticulation and ground-glass opacity throughout both lungs with a mild basilar predominance, nonspecific, suggestive of fibrotic interstitial lung disease, with UIP not excluded. Pulmonology consultation and high-resolution chest CT evaluation may be obtained as clinically warranted. Will follow on repeat CT for pum nodules.   Pulmonary nodules: pre TAVR CT showed solid posterior left lower lobe pulmonary nodules, largest 5 mm. Follow-up chest CT suggested in 3-6 months given history of prostate cancer. Will get this set up    Erectile dysfunction: Rx'd Cialis today.   Medication Adjustments/Labs and Tests Ordered: Current medicines are reviewed at length with the patient today.  Concerns regarding medicines are outlined above.  Orders Placed This Encounter  Procedures   CT Chest Wo Contrast   EKG 12-Lead   Meds ordered this encounter  Medications   tadalafil (CIALIS) 20 MG tablet    Sig: Take 1 tablet (20 mg total) by mouth daily as needed for erectile dysfunction.    Dispense:  10 tablet     Refill:  6    Order Specific Question:   Supervising Provider    Answer:   COOPER, MICHAEL [[id number]]   amoxicillin (AMOXIL) 500 MG tablet    Sig: Take 4 tablets (2,000 mg total) by mouth as directed. 1 hour prior to dental work including cleanings    Dispense:  12 tablet    Refill:  12    Order Specific Question:   Supervising Provider    Answer:   Burt Knack, MICHAEL [[id number]]   amLODipine (NORVASC) 10 MG tablet    Sig: Take 1 tablet (10 mg total) by mouth daily.    Dispense:  90 tablet    Refill:  3    Patient Instructions  Medication Instructions:  Increase Amlodipine to 10 mg daily   *If you need a refill on your cardiac medications before your next appointment, please call your pharmacy*   Lab Work: None ordered   If you have labs (blood work) drawn today and your tests are completely normal, you will receive your results only by: [place] (if you have MyChart) OR A paper  copy in the mail If you have any lab test that is abnormal or we need to change your treatment, we will call you to review the results.   Testing/Procedures: Your physician has recommending that you have a chest CT in Early April   Follow-Up: Follow up as scheduled    Other Instructions None     Signed, Angelena Form, PA-C  05/19/2021 5:09 PM    [facility]

## 2021-05-19 NOTE — Patient Instructions (Addendum)
Medication Instructions:  Increase Amlodipine to 10 mg daily   *If you need a refill on your cardiac medications before your next appointment, please call your pharmacy*   Lab Work: None ordered   If you have labs (blood work) drawn today and your tests are completely normal, you will receive your results only by: [place] (if you have MyChart) OR A paper copy in the mail If you have any lab test that is abnormal or we need to change your treatment, we will call you to review the results.   Testing/Procedures: Your physician has recommending that you have a chest CT in Early April   Follow-Up: Follow up as scheduled    Other Instructions None

## 2021-06-09 ENCOUNTER — Other Ambulatory Visit: Payer: Self-pay

## 2021-06-09 ENCOUNTER — Ambulatory Visit (HOSPITAL_COMMUNITY): Payer: Medicare Other | Attending: Physician Assistant

## 2021-06-09 ENCOUNTER — Ambulatory Visit (INDEPENDENT_AMBULATORY_CARE_PROVIDER_SITE_OTHER): Payer: Medicare Other | Admitting: Physician Assistant

## 2021-06-09 VITALS — BP 130/60 | HR 59 | Ht 71.5 in | Wt 179.0 lb

## 2021-06-09 DIAGNOSIS — R9389 Abnormal findings on diagnostic imaging of other specified body structures: Secondary | ICD-10-CM

## 2021-06-09 DIAGNOSIS — R911 Solitary pulmonary nodule: Secondary | ICD-10-CM

## 2021-06-09 DIAGNOSIS — Z952 Presence of prosthetic heart valve: Secondary | ICD-10-CM | POA: Diagnosis present

## 2021-06-09 DIAGNOSIS — I10 Essential (primary) hypertension: Secondary | ICD-10-CM

## 2021-06-09 DIAGNOSIS — N529 Male erectile dysfunction, unspecified: Secondary | ICD-10-CM

## 2021-06-09 LAB — ECHOCARDIOGRAM COMPLETE
AR max vel: 1.7 cm2
AV Area VTI: 1.78 cm2
AV Area mean vel: 1.64 cm2
AV Mean grad: 12.5 mmHg
AV Peak grad: 22.7 mmHg
Ao pk vel: 2.38 m/s
Area-P 1/2: 2.74 cm2
P 1/2 time: 855 msec
S' Lateral: 2.1 cm

## 2021-06-09 MED ORDER — CETIRIZINE HCL 10 MG PO TABS: 10.0000 mg | ORAL_TABLET | Freq: Every day | ORAL | 3 refills | Status: DC

## 2021-06-09 NOTE — Progress Notes (Signed)
[place]                                     Cardiology Office Note:    Date:  06/10/2021   ID:  Rodney Patterson, DOB 12/23/1932, MRN [id number]  PCP:  Lavone Orn, MD  [facility] Cardiologist:  [name], MD  [facility] Electrophysiologist:  None   Referring MD: Lavone Orn, MD   1 month s/p TAVR  History of Present Illness:    Rodney Patterson is a 88 y.o. male with a hx of prostate CA s/p seed XRT, HLD, HTN, CKD stage II, CAD, and critical AS s/p TAVR (05/11/21) who presents to clinic for follow up.   The patient has been healthy and active. He developed exertional fatigue and dyspnea. He had a murmur on exam prompting an echo by his PCP. Echo 04/12/21 showed EF 60-65% with critical AS with a mean gradient of 69 mm hg, peak 120 mm hg, AVA 0.93 cm2, DVI 0.22, mild AI. [facility] 04/29/21 showed patent left main, LAD, dominant RCA, and large intermediate branches with mild nonobstructive disease as well as a severe diagonal stenosis, small to moderate caliber vessels and chronic occlusion of a small OM1 branch. Medical therapy was recommended.   He was evaluated by the multidisciplinary valve team and underwent successful TAVR with a 26 mm Edwards Sapien 3 Ultra Resilia THV via the TF approach on 05/11/21. Post operative echo showed EF >75%, normally functioning TAVR with a mean gradient of 11 mmHg and trivial PVL. He was discharged on aspirin alone. At follow up he was doing very well. BP elevated and Norvasc increased from 5mg  --> 10mg  daily.   Today the patient presents to clinic for follow up. No CP or SOB. No LE edema, orthopnea or PND. No dizziness or syncope. No blood in stool or urine. No palpitations.   Past Medical History:  Diagnosis Date   Bilateral tinnitus    Carotid stenosis, bilateral    per duplex 02-11-2016  bilateral ICA less than50% stenosis   CKD (chronic kidney disease) stage 3, GFR 30-59 ml/min  (HCC)    Coronary artery disease    Dyspnea    Erectile dysfunction    History of hypertension    per pt pcp took him off medication approx. 2016   Hyperlipidemia    Hyperplasia of prostate with lower urinary tract symptoms (LUTS)    Mild acid reflux    watches diet   Nocturia more than twice per night    Postcholecystectomy diarrhea    Prostate cancer [facility]) urologist-  dr wrenn/ oncologist-  dr Tammi Klippel   dx 06/ 2018-- Stage cT2c, Gleason 3+4, PSA10.1, vol 98cc-- plant external beam radiation   S/P TAVR (transcatheter aortic valve replacement) 05/11/2021   s/p TAVR wirh 26 mm Edwards S3UR via the TF approach by Dr. Burt Knack and Dr. Cyndia Bent (TAVR)   Wears glasses    Wears partial dentures    upper   White coat syndrome without diagnosis of hypertension     Past Surgical History:  Procedure Laterality Date   CATARACT EXTRACTION W/ INTRAOCULAR LENS  IMPLANT, BILATERAL  2016 approx.   COLONOSCOPY  last one 02-17-2004   GOLD SEED IMPLANT N/A 12/27/2016   Procedure: GOLD SEED IMPLANT WITH SPACE OAR;  Surgeon: Irine Seal, MD;  Location: [facility];  Service: Urology;  Laterality: N/A;   INTRAOPERATIVE TRANSTHORACIC ECHOCARDIOGRAM Left 05/11/2021   Procedure: INTRAOPERATIVE TRANSTHORACIC ECHOCARDIOGRAM;  Surgeon: [name], MD;  Location: [place];  Service: Open Heart Surgery;  Laterality: Left;   LAPAROSCOPIC CHOLECYSTECTOMY  01-18-2003  dr Margot Chimes   PROSTATE BIOPSY  10-26-2016  dr Jeffie Pollock (office)   RIGHT HEART CATH AND CORONARY ANGIOGRAPHY N/A 04/29/2021   Procedure: RIGHT HEART CATH AND CORONARY ANGIOGRAPHY;  Surgeon: [name], MD;  Location: [place] [facility];  Service: Cardiovascular;  Laterality: N/A;   TRANSCATHETER AORTIC VALVE REPLACEMENT, TRANSFEMORAL Bilateral 05/11/2021   Procedure: TRANSCATHETER AORTIC VALVE REPLACEMENT, TRANSFEMORAL;  Surgeon: [name], MD;  Location: [place];  Service: Open Heart Surgery;  Laterality: Bilateral;   ULTRASOUND  GUIDANCE FOR VASCULAR ACCESS Bilateral 05/11/2021   Procedure: ULTRASOUND GUIDANCE FOR VASCULAR ACCESS;  Surgeon: [name], MD;  Location: [place];  Service: Open Heart Surgery;  Laterality: Bilateral;    Current Medications: Current Meds  Medication Sig   acetaminophen (TYLENOL) 500 MG tablet Take 500 mg by mouth every 6 (six) hours as needed for moderate pain.   amLODipine (NORVASC) 10 MG tablet Take 1 tablet (10 mg total) by mouth daily.   amoxicillin (AMOXIL) 500 MG tablet Take 4 tablets (2,000 mg total) by mouth as directed. 1 hour prior to dental work including cleanings   Ascorbic Acid (VITAMIN C) 1000 MG tablet Take 1,000 mg by mouth daily.   aspirin 81 MG tablet Take 1 tablet (81 mg total) by mouth daily.   betamethasone dipropionate 0.05 % cream Apply 1 application topically 2 (two) times daily as needed (itching/rash).   cetirizine (ZYRTEC ALLERGY) 10 MG tablet Take 1 tablet (10 mg total) by mouth daily.   Cholecalciferol (VITAMIN D3) 5000 units CAPS Take 5,000 Units by mouth every morning.   dorzolamide (TRUSOPT) 2 % ophthalmic solution Place 1 drop into both eyes 2 (two) times daily.   loperamide (IMODIUM) 2 MG capsule Take 2 mg by mouth daily as needed for diarrhea or loose stools.   methylcellulose (CITRUCEL) oral powder Take 2-3 teaspoonful oral daily   Omega-3 1000 MG CAPS Take 1,000 mg by mouth daily.   OVER THE COUNTER MEDICATION Take 1 tablet by mouth daily. [employer]   pravastatin (PRAVACHOL) 20 MG tablet Take 20 mg by mouth every other day.   Propylene Glycol (SYSTANE COMPLETE) 0.6 % SOLN Place 1 drop into both eyes daily.   tadalafil (CIALIS) 20 MG tablet Take 1 tablet (20 mg total) by mouth daily as needed for erectile dysfunction.     Allergies:   Honey bee venom [bee venom], Adhesive [tape], Rosuvastatin calcium, and Tetanus immune globulin   Social History   Socioeconomic History   Marital status: Married    Spouse name: Not on file   Number of  children: Not on file   Years of education: Not on file   Highest education level: Not on file  Occupational History   Not on file  Tobacco Use   Smoking status: Former    Types: Cigars, Pipe    Quit date: 05/03/1967    Years since quitting: 54.1   Smokeless tobacco: Never  Vaping Use   Vaping Use: Never used  Substance and Sexual Activity   Alcohol use: Not Currently   Drug use: No   Sexual activity: Not on file  Other Topics Concern   Not on file  Social History Narrative   Not on file   Social Determinants of  Health   Financial Resource Strain: Not on file  Food Insecurity: Not on file  Transportation Needs: Not on file  Physical Activity: Not on file  Stress: Not on file  Social Connections: Not on file     Family History: The patient's family history includes Heart attack in his mother. There is no history of Cancer, Esophageal cancer, or Colon cancer.  ROS:   Please see the history of present illness.    All other systems reviewed and are negative.  EKGs/Labs/Other Studies Reviewed:    The following studies were reviewed today:    TAVR OPERATIVE NOTE     Date of Procedure:                05/11/2021   Preoperative Diagnosis:      Severe Aortic Stenosis    Postoperative Diagnosis:    Same    Procedure:        Transcatheter Aortic Valve Replacement - Percutaneous Right Transfemoral Approach             Edwards Sapien 3 Ultra Resilia THV (size 26 mm, model # 9755RSL, serial # [id number])              Co-Surgeons:                        Gaye Pollack, MD and [name], MD    Anesthesiologist:                  [place], MD   Echocardiographer:              Bertrum Sol, MD   Pre-operative Echo Findings: Critical aortic stenosis Normal left ventricular systolic function   Post-operative Echo Findings: Trace paravalvular leak Normal left ventricular systolic function   _____________     Echo 05/12/21: IMPRESSIONS  1. Left ventricular ejection  fraction, by estimation, is >75%. The left  ventricle has hyperdynamic function. The left ventricle has no regional  wall motion abnormalities. There is mild concentric left ventricular  hypertrophy. Left ventricular diastolic  parameters are consistent with Grade I diastolic dysfunction (impaired  relaxation).   2. Right ventricular systolic function is hyperdynamic. The right  ventricular size is normal. Tricuspid regurgitation signal is inadequate  for assessing PA pressure.   3. Left atrial size was mildly dilated.   4. The mitral valve is normal in structure. No evidence of mitral valve  regurgitation. No evidence of mitral stenosis.   5. There is a trivial perivalvular leak towards the mitral annulus. The  aortic valve has been repaired/replaced. Aortic valve regurgitation is  trivial. There is a 23 mm Ultra, stented (TAVR) valve present in the  aortic position. Procedure Date:  05/11/2021. Aortic valve mean gradient measures 11.0 mmHg. Aortic valve  Vmax measures 2.25 m/s. Aortic valve acceleration time measures 58 msec.   _______________________  Echo 06/09/21 IMPRESSIONS   1. Left ventricular ejection fraction, by estimation, is 60 to 65%. The  left ventricle has normal function. The left ventricle has no regional  wall motion abnormalities. There is mild left ventricular hypertrophy.  Left ventricular diastolic parameters  are consistent with Grade I diastolic dysfunction (impaired relaxation).  The average left ventricular global longitudinal strain is 24.4 %. The  global longitudinal strain is normal.   2. Right ventricular systolic function is normal. The right ventricular  size is normal. There is normal pulmonary artery systolic pressure. The  estimated right ventricular systolic pressure  is 24.9 mmHg.   3. The mitral valve is normal in structure. No evidence of mitral valve  regurgitation. No evidence of mitral stenosis. Moderate mitral annular  calcification.   4.  Bioprosthetic aortic valve s/p TAVR, Edwards 26 mm THV. Mean gradient  13 mmHg. Dimensionless index 0.53. Mild perivalvular leakage.   5. Aortic dilatation noted. There is mild dilatation of the ascending  aorta, measuring 38 mm.   6. Left atrial size was mildly dilated.   7. The inferior vena cava is normal in size with greater than 50%  respiratory variability, suggesting right atrial pressure of 3 mmHg.   EKG:  EKG is NOT ordered today.    Recent Labs: 05/07/2021: ALT 19; B Natriuretic Peptide 78.2 05/12/2021: BUN 20; Creatinine, Ser 1.48; Hemoglobin 12.2; Magnesium 1.8; Platelets 140; Potassium 3.9; Sodium 137  Recent Lipid Panel No results found for: CHOL, TRIG, HDL, CHOLHDL, VLDL, LDLCALC, LDLDIRECT   Risk Assessment/Calculations:       Physical Exam:    VS:  BP 130/60 (BP Location: Left Arm, Patient Position: Sitting, Cuff Size: Normal)    Pulse (!) 59    Ht 5' 11.5" (1.816 m)    Wt 179 lb (81.2 kg)    SpO2 96%    BMI 24.62 kg/m     Wt Readings from Last 3 Encounters:  06/09/21 179 lb (81.2 kg)  05/19/21 180 lb (81.6 kg)  05/12/21 180 lb 12.4 oz (82 kg)     GEN:  Well nourished, well developed in no acute distress HEENT: Normal NECK: No JVD LYMPHATICS: No lymphadenopathy CARDIAC: RRR, no murmurs, rubs, gallops RESPIRATORY:  Clear to auscultation without rales, wheezing or rhonchi  ABDOMEN: Soft, non-tender, non-distended MUSCULOSKELETAL:  No edema; No deformity  SKIN: Warm and dry.  NEUROLOGIC:  Alert and oriented x 3 PSYCHIATRIC:  Normal affect   ASSESSMENT:    1. S/P TAVR (transcatheter aortic valve replacement)   2. Essential hypertension   3. Abnormal CT scan, chest   4. Pulmonary nodule   5. Erectile dysfunction, unspecified erectile dysfunction type     PLAN:    In order of problems listed above:  Severe AS s/p TAVR: echo today shows EF 60%, normally functioning TAVR with a mean gradient of 13 mm hg and mild PVL. He has NYHA class I symptoms. SBE  prophylaxis discussed; he has RX'd amoxicillin. I will see him back in 6 months for follow up.    HTN: BP well controlled today and on home log.    Abnormal lung CT: pre TAVR CTs showed moderate patchy subpleural reticulation and ground-glass opacity throughout both lungs with a mild basilar predominance, nonspecific, suggestive of fibrotic interstitial lung disease, with UIP not excluded. Pulmonology consultation and high-resolution chest CT evaluation may be obtained as clinically warranted. Will follow on repeat CT for pum nodules (see below)   Pulmonary nodules: pre TAVR CT showed solid posterior left lower lobe pulmonary nodules, largest 5 mm. Follow-up chest CT suggested in 3-6 months given history of prostate cancer. This is set up for 08/02/21  Erectile dysfunction: Cialis did not work for him. Follow up with urology.   Medication Adjustments/Labs and Tests Ordered: Current medicines are reviewed at length with the patient today.  Concerns regarding medicines are outlined above.  Orders Placed This Encounter  Procedures   ECHOCARDIOGRAM COMPLETE   Meds ordered this encounter  Medications   cetirizine (ZYRTEC ALLERGY) 10 MG tablet    Sig: Take 1 tablet (10 mg total) by  mouth daily.    Dispense:  90 tablet    Refill:  3    Patient Instructions  Medication Instructions:  Your physician recommends that you continue on your current medications as directed. Please refer to the Current Medication list given to you today.  *If you need a refill on your cardiac medications before your next appointment, please call your pharmacy*   Lab Work: None ordered   If you have labs (blood work) drawn today and your tests are completely normal, you will receive your results only by: [place] (if you have MyChart) OR A paper copy in the mail If you have any lab test that is abnormal or we need to change your treatment, we will call you to review the  results.   Testing/Procedures: None ordered today    Follow-Up: Follow up as scheduled    Other Instructions None     Signed, Angelena Form, PA-C  06/10/2021 9:09 AM    [facility]

## 2021-06-09 NOTE — Patient Instructions (Signed)
Medication Instructions:  Your physician recommends that you continue on your current medications as directed. Please refer to the Current Medication list given to you today.  *If you need a refill on your cardiac medications before your next appointment, please call your pharmacy*   Lab Work: None ordered   If you have labs (blood work) drawn today and your tests are completely normal, you will receive your results only by: [place] (if you have MyChart) OR A paper copy in the mail If you have any lab test that is abnormal or we need to change your treatment, we will call you to review the results.   Testing/Procedures: None ordered today    Follow-Up: Follow up as scheduled    Other Instructions None

## 2021-06-25 ENCOUNTER — Other Ambulatory Visit (HOSPITAL_COMMUNITY): Payer: Self-pay | Admitting: Urology

## 2021-06-25 DIAGNOSIS — C61 Malignant neoplasm of prostate: Secondary | ICD-10-CM

## 2021-07-01 ENCOUNTER — Telehealth: Payer: Self-pay | Admitting: Physician Assistant

## 2021-07-01 NOTE — Telephone Encounter (Signed)
You are right. I discussed with radiology and both studies are still necessary. Thank you ?

## 2021-07-01 NOTE — Telephone Encounter (Signed)
Returned patient's call. ? ?Patient states he is having a PET scan at [facility] tomorrow, from skull to mid thigh. He also has a Chest CT scheduled for 08/02/21. He is asking if he needs to have the CT or if Angelena Form, PA-C can see what is needed from the PET scan so he does not have to go in for another scan next month. ? ?Will forward to Angelena Form, PA-C to review and advise. ? ?Patient verbalized understanding and thanked me for calling. ?

## 2021-07-01 NOTE — Telephone Encounter (Signed)
? ?  Pt is requesting to speak with Rodney Patterson, he said its regarding his test/procedure tomorrow ?

## 2021-07-01 NOTE — Telephone Encounter (Signed)
Called to inform patient he does need to have both his PET scan and Chest CT performed per Angelena Form, PA-C. ? ?Patient verbalized understanding. ?

## 2021-07-02 ENCOUNTER — Other Ambulatory Visit: Payer: Self-pay

## 2021-07-02 ENCOUNTER — Encounter (HOSPITAL_COMMUNITY)
Admission: RE | Admit: 2021-07-02 | Discharge: 2021-07-02 | Disposition: A | Discharge status: Home / Self Care | Payer: Medicare Other | Source: Ambulatory Visit | Attending: Urology | Admitting: Urology

## 2021-07-02 DIAGNOSIS — C61 Malignant neoplasm of prostate: Secondary | ICD-10-CM | POA: Diagnosis present

## 2021-07-02 MED ORDER — PIFLIFOLASTAT F 18 (PYLARIFY) INJECTION
9.0000 | Freq: Once | INTRAVENOUS | Status: AC
  Administered 2021-07-02: 9 via INTRAVENOUS

## 2021-07-07 ENCOUNTER — Other Ambulatory Visit: Payer: Self-pay | Admitting: Urology

## 2021-07-07 DIAGNOSIS — R930 Abnormal findings on diagnostic imaging of skull and head, not elsewhere classified: Secondary | ICD-10-CM

## 2021-08-02 ENCOUNTER — Ambulatory Visit (HOSPITAL_COMMUNITY): Payer: Medicare Other

## 2021-10-25 ENCOUNTER — Ambulatory Visit (HOSPITAL_COMMUNITY): Admission: RE | Admit: 2021-10-25 | Payer: Medicare Other | Source: Ambulatory Visit

## 2021-10-27 ENCOUNTER — Ambulatory Visit (HOSPITAL_COMMUNITY)
Admission: RE | Admit: 2021-10-27 | Discharge: 2021-10-27 | Disposition: A | Discharge status: Home / Self Care | Payer: Medicare Other | Source: Ambulatory Visit | Attending: Physician Assistant | Admitting: Physician Assistant

## 2021-10-27 DIAGNOSIS — R911 Solitary pulmonary nodule: Secondary | ICD-10-CM | POA: Diagnosis present

## 2021-10-28 ENCOUNTER — Telehealth: Payer: Self-pay

## 2021-10-28 NOTE — Telephone Encounter (Signed)
-----   Message from Eileen Stanford, PA-C sent at 10/28/2021 10:55 AM EDT ----- Can you please let the pt know that his nodules are overall stable but may require further follow up. Additionally, there are some other abnormalities suggesting interstitial lung disease and the radiologist recommends he see pulmonary. Can we send that referral? Thank you!

## 2021-10-28 NOTE — Telephone Encounter (Signed)
Patient refusing to have referral placed.

## 2021-12-06 NOTE — Progress Notes (Unsigned)
[place]                                     Cardiology Office Note:    Date:  12/08/2021   ID:  [name], DOB 1933-04-12, MRN [id number]  PCP:  Rodney Orn, MD  [facility] Cardiologist:  [name], MD  [place] Electrophysiologist:  None   Referring MD: Rodney Orn, MD   6 month follow up.   History of Present Illness:    Rodney Patterson is a 88 y.o. male with a hx of prostate CA s/p seed XRT, HLD, HTN, CKD stage II, CAD, and critical AS s/p TAVR (05/11/21) who presents to clinic for follow up.   The patient has been healthy and active. He developed exertional fatigue and dyspnea. He had a murmur on exam prompting an echo by his PCP. Echo 04/12/21 showed EF 60-65% with critical AS with a mean gradient of 69 mm hg, peak 120 mm hg, AVA 0.93 cm2, DVI 0.22, mild AI. [facility] 04/29/21 showed patent left main, LAD, dominant RCA, and large intermediate branches with mild nonobstructive disease as well as a severe diagonal stenosis, small to moderate caliber vessels and chronic occlusion of a small OM1 branch. Medical therapy was recommended.   He was evaluated by the multidisciplinary valve team and underwent successful TAVR with a 26 mm Edwards Sapien 3 Ultra Resilia THV via the TF approach on 05/11/21. Post operative echo showed EF >75%, normally functioning TAVR with a mean gradient of 11 mmHg and trivial PVL. He was discharged on aspirin alone. At follow up he was doing very well. BP elevated and Norvasc increased from '5mg'$  --> '10mg'$  daily. 1 month echo showed EF 60%, normally functioning TAVR with a mean gradient of 13 mm hg and mild PVL.  Today the patient presents to clinic for follow up. He is doing well but notices LE edema after works out 2-3 times a week. No CP or SOB. No LE edema, orthopnea or PND. No dizziness or syncope. No blood in stool or urine. No palpitations. His biggest complaint is nocturnal urinary  frequency. This keeps him up all night. He wants to start hunting more.   Past Medical History:  Diagnosis Date   Bilateral tinnitus    Carotid stenosis, bilateral    per duplex 02-11-2016  bilateral ICA less than50% stenosis   CKD (chronic kidney disease) stage 3, GFR 30-59 ml/min (HCC)    Coronary artery disease    Dyspnea    Erectile dysfunction    History of hypertension    per pt pcp took him off medication approx. 2016   Hyperlipidemia    Hyperplasia of prostate with lower urinary tract symptoms (LUTS)    Mild acid reflux    watches diet   Nocturia more than twice per night    Postcholecystectomy diarrhea    Prostate cancer [facility]) urologist-  dr wrenn/ oncologist-  dr Tammi Klippel   dx 06/ 2018-- Stage cT2c, Gleason 3+4, PSA10.1, vol 98cc-- plant external beam radiation   S/P TAVR (transcatheter aortic valve replacement) 05/11/2021   s/p TAVR wirh 26 mm Edwards S3UR via the TF approach by Dr. Burt Knack and Dr. Cyndia Bent (TAVR)   Wears glasses    Wears partial dentures    upper   White coat syndrome without diagnosis of hypertension     Past  Surgical History:  Procedure Laterality Date   CATARACT EXTRACTION W/ INTRAOCULAR LENS  IMPLANT, BILATERAL  2016 approx.   COLONOSCOPY  last one 02-17-2004   GOLD SEED IMPLANT N/A 12/27/2016   Procedure: GOLD SEED IMPLANT WITH SPACE OAR;  Surgeon: Irine Seal, MD;  Location[facility];  Service: Urology;  Laterality: N/A;   INTRAOPERATIVE TRANSTHORACIC ECHOCARDIOGRAM Left 05/11/2021   Procedure: INTRAOPERATIVE TRANSTHORACIC ECHOCARDIOGRAM;  Surgeon: [name], MD;  Location: [place];  Service: Open Heart Surgery;  Laterality: Left;   LAPAROSCOPIC CHOLECYSTECTOMY  01-18-2003  dr Margot Chimes   PROSTATE BIOPSY  10-26-2016  dr Jeffie Pollock (office)   RIGHT HEART CATH AND CORONARY ANGIOGRAPHY N/A 04/29/2021   Procedure: RIGHT HEART CATH AND CORONARY ANGIOGRAPHY;  Surgeon: [name], MD;  Location: [place] [facility];  Service:  Cardiovascular;  Laterality: N/A;   TRANSCATHETER AORTIC VALVE REPLACEMENT, TRANSFEMORAL Bilateral 05/11/2021   Procedure: TRANSCATHETER AORTIC VALVE REPLACEMENT, TRANSFEMORAL;  Surgeon: [name], MD;  Location: [place];  Service: Open Heart Surgery;  Laterality: Bilateral;   ULTRASOUND GUIDANCE FOR VASCULAR ACCESS Bilateral 05/11/2021   Procedure: ULTRASOUND GUIDANCE FOR VASCULAR ACCESS;  Surgeon: [name], MD;  Location: [place];  Service: Open Heart Surgery;  Laterality: Bilateral;    Current Medications: Current Meds  Medication Sig   acetaminophen (TYLENOL) 500 MG tablet Take 500 mg by mouth every 6 (six) hours as needed for moderate pain.   amLODipine (NORVASC) 5 MG tablet Take 1 tablet (5 mg total) by mouth daily.   amoxicillin (AMOXIL) 500 MG tablet Take 4 tablets (2,000 mg total) by mouth as directed. 1 hour prior to dental work including cleanings   Ascorbic Acid (VITAMIN C) 1000 MG tablet Take 1,000 mg by mouth daily.   aspirin 81 MG tablet Take 1 tablet (81 mg total) by mouth daily.   betamethasone dipropionate 0.05 % cream Apply 1 application topically 2 (two) times daily as needed (itching/rash).   cetirizine (ZYRTEC ALLERGY) 10 MG tablet Take 1 tablet (10 mg total) by mouth daily.   Cholecalciferol (VITAMIN D3) 5000 units CAPS Take 5,000 Units by mouth every morning.   dorzolamide (TRUSOPT) 2 % ophthalmic solution Place 1 drop into both eyes 2 (two) times daily.   loperamide (IMODIUM) 2 MG capsule Take 2 mg by mouth daily as needed for diarrhea or loose stools.   methylcellulose (CITRUCEL) oral powder Take 2-3 teaspoonful oral daily   Omega-3 1000 MG CAPS Take 1,000 mg by mouth daily.   OVER THE COUNTER MEDICATION Take 1 tablet by mouth daily. [employer]   pravastatin (PRAVACHOL) 20 MG tablet Take 20 mg by mouth every other day.   Propylene Glycol (SYSTANE COMPLETE) 0.6 % SOLN Place 1 drop into both eyes daily.   tadalafil (CIALIS) 20 MG tablet Take 1 tablet (20  mg total) by mouth daily as needed for erectile dysfunction.   [DISCONTINUED] amLODipine (NORVASC) 10 MG tablet Take 1 tablet (10 mg total) by mouth daily.     Allergies:   Honey bee venom [bee venom], Adhesive [tape], Rosuvastatin calcium, and Tetanus immune globulin   Social History   Socioeconomic History   Marital status: Married    Spouse name: Not on file   Number of children: Not on file   Years of education: Not on file   Highest education level: Not on file  Occupational History   Not on file  Tobacco Use   Smoking status: Former    Types: Cigars, Pipe  Quit date: 05/03/1967    Years since quitting: 54.6   Smokeless tobacco: Never  Vaping Use   Vaping Use: Never used  Substance and Sexual Activity   Alcohol use: Not Currently   Drug use: No   Sexual activity: Not on file  Other Topics Concern   Not on file  Social History Narrative   Not on file   Social Determinants of Health   Financial Resource Strain: Not on file  Food Insecurity: Not on file  Transportation Needs: Not on file  Physical Activity: Not on file  Stress: Not on file  Social Connections: Not on file     Family History: The patient's family history includes Heart attack in his mother. There is no history of Cancer, Esophageal cancer, or Colon cancer.  ROS:   Please see the history of present illness.    All other systems reviewed and are negative.  EKGs/Labs/Other Studies Reviewed:    The following studies were reviewed today:    TAVR OPERATIVE NOTE     Date of Procedure:                05/11/2021   Preoperative Diagnosis:      Severe Aortic Stenosis    Postoperative Diagnosis:    Same    Procedure:        Transcatheter Aortic Valve Replacement - Percutaneous Right Transfemoral Approach             Edwards Sapien 3 Ultra Resilia THV (size 26 mm, model # 9755RSL, serial # [id number])              Co-Surgeons:                        Gaye Pollack, MD and [name], MD     Anesthesiologist:                  Collins Scotland, MD   Echocardiographer:              Bertrum Sol, MD   Pre-operative Echo Findings: Critical aortic stenosis Normal left ventricular systolic function   Post-operative Echo Findings: Trace paravalvular leak Normal left ventricular systolic function   _____________     Echo 05/12/21: IMPRESSIONS  1. Left ventricular ejection fraction, by estimation, is >75%. The left  ventricle has hyperdynamic function. The left ventricle has no regional  wall motion abnormalities. There is mild concentric left ventricular  hypertrophy. Left ventricular diastolic  parameters are consistent with Grade I diastolic dysfunction (impaired  relaxation).   2. Right ventricular systolic function is hyperdynamic. The right  ventricular size is normal. Tricuspid regurgitation signal is inadequate  for assessing PA pressure.   3. Left atrial size was mildly dilated.   4. The mitral valve is normal in structure. No evidence of mitral valve  regurgitation. No evidence of mitral stenosis.   5. There is a trivial perivalvular leak towards the mitral annulus. The  aortic valve has been repaired/replaced. Aortic valve regurgitation is  trivial. There is a 23 mm Ultra, stented (TAVR) valve present in the  aortic position. Procedure Date:  05/11/2021. Aortic valve mean gradient measures 11.0 mmHg. Aortic valve  Vmax measures 2.25 m/s. Aortic valve acceleration time measures 58 msec.   _______________________  Echo 06/09/21 IMPRESSIONS   1. Left ventricular ejection fraction, by estimation, is 60 to 65%. The  left ventricle has normal function. The left ventricle has no regional  wall motion  abnormalities. There is mild left ventricular hypertrophy.  Left ventricular diastolic parameters  are consistent with Grade I diastolic dysfunction (impaired relaxation).  The average left ventricular global longitudinal strain is 24.4 %. The  global longitudinal strain is  normal.   2. Right ventricular systolic function is normal. The right ventricular  size is normal. There is normal pulmonary artery systolic pressure. The  estimated right ventricular systolic pressure is 46.9 mmHg.   3. The mitral valve is normal in structure. No evidence of mitral valve  regurgitation. No evidence of mitral stenosis. Moderate mitral annular  calcification.   4. Bioprosthetic aortic valve s/p TAVR, Edwards 26 mm THV. Mean gradient  13 mmHg. Dimensionless index 0.53. Mild perivalvular leakage.   5. Aortic dilatation noted. There is mild dilatation of the ascending  aorta, measuring 38 mm.   6. Left atrial size was mildly dilated.   7. The inferior vena cava is normal in size with greater than 50%  respiratory variability, suggesting right atrial pressure of 3 mmHg.   EKG:  EKG is NOT ordered today.    Recent Labs: 05/07/2021: ALT 19; B Natriuretic Peptide 78.2 05/12/2021: BUN 20; Creatinine, Ser 1.48; Hemoglobin 12.2; Magnesium 1.8; Platelets 140; Potassium 3.9; Sodium 137  Recent Lipid Panel No results found for: "CHOL", "TRIG", "HDL", "CHOLHDL", "VLDL", "LDLCALC", "LDLDIRECT"   Risk Assessment/Calculations:       Physical Exam:    VS:  BP 130/70 (BP Location: Left Arm, Patient Position: Sitting, Cuff Size: Normal)   Pulse (!) 58   Ht 5' 11.5" (1.816 m)   Wt 180 lb (81.6 kg)   SpO2 98%   BMI 24.76 kg/m     Wt Readings from Last 3 Encounters:  12/08/21 180 lb (81.6 kg)  06/09/21 179 lb (81.2 kg)  05/19/21 180 lb (81.6 kg)     GEN:  Well nourished, well developed in no acute distress HEENT: Normal NECK: No JVD LYMPHATICS: No lymphadenopathy CARDIAC: RRR, no murmurs, rubs, gallops RESPIRATORY:  Clear to auscultation without rales, wheezing or rhonchi  ABDOMEN: Soft, non-tender, non-distended MUSCULOSKELETAL:  No edema; No deformity  SKIN: Warm and dry.  NEUROLOGIC:  Alert and oriented x 3 PSYCHIATRIC:  Normal affect   ASSESSMENT:    1. S/P TAVR  (transcatheter aortic valve replacement)   2. Essential hypertension   3. Abnormal CT scan, chest   4. Pulmonary nodule   5. Nocturia      PLAN:    In order of problems listed above:  Severe AS s/p TAVR: echo in 06/2021 showed EF 60%, normally functioning TAVR with a mean gradient of 13 mm hg and mild PVL. He has NYHA class I symptoms. SBE prophylaxis discussed; he has amoxicillin. I will see him back in January for his 1 year TAVR follow up.    HTN: BP well controlled today but having LE edema, worse after exercise. Will see if cutting Norvasc back to '5mg'$  daily helps. He will keep a log of BPs at home and call us if consistently >140/90   Abnormal lung CT: chronic changes indicative of interstitial lung disease redemonstrated, as above, with a spectrum of findings considered indeterminate for usual interstitial pneumonia (UIP) per current ATS guidelines. Outpatient referral to Pulmonology for further clinical evaluation is recommended. Follow-up high-resolution chest CT is recommended in 12 months to assess for temporal changes in the appearance of the lung parenchyma. Patient has refused pulmonary eval   Pulmonary nodules: repeat CT in 07/2021 showed "small pulmonary nodules measuring  5 mm or less in size, stable compared to prior studies, favored to be benign. No follow-up needed if patient is low-risk (and has no known or suspected primary neoplasm). Non-contrast chest CT can be considered in 12 months if patient is high-risk.   Nocturia: wants to go back on his supplement "Super Beta Prostate" which I have cleared. This very bothersome to him and keeps him up all night. I urged him to follow up with Dr. Jeffie Pollock if this doesn't help for trial of BPH meds.    Medication Adjustments/Labs and Tests Ordered: Current medicines are reviewed at length with the patient today.  Concerns regarding medicines are outlined above.  No orders of the defined types were placed in this encounter.  Meds  ordered this encounter  Medications   amLODipine (NORVASC) 5 MG tablet    Sig: Take 1 tablet (5 mg total) by mouth daily.    Dispense:  90 tablet    Refill:  3    Dose decrease    Patient Instructions  Medication Instructions:  Your physician has recommended you make the following change in your medication:  1.) decrease amlodipine to 5 mg - one tablet daily --please keep a check on your blood pressures at home and call if you are consistently getting readings higher than 140/ (top number) or /90 (bottom number)  *If you need a refill on your cardiac medications before your next appointment, please call your pharmacy*   Lab Work: None   Testing/Procedures: none   Follow-Up: As planned in January   Important Information About Sugar         Signed, Angelena Form, PA-C  12/08/2021 1:52 PM    [place]

## 2021-12-08 ENCOUNTER — Ambulatory Visit (INDEPENDENT_AMBULATORY_CARE_PROVIDER_SITE_OTHER): Payer: Medicare Other | Admitting: Physician Assistant

## 2021-12-08 VITALS — BP 130/70 | HR 58 | Ht 71.5 in | Wt 180.0 lb

## 2021-12-08 DIAGNOSIS — I10 Essential (primary) hypertension: Secondary | ICD-10-CM | POA: Diagnosis not present

## 2021-12-08 DIAGNOSIS — R9389 Abnormal findings on diagnostic imaging of other specified body structures: Secondary | ICD-10-CM

## 2021-12-08 DIAGNOSIS — Z952 Presence of prosthetic heart valve: Secondary | ICD-10-CM

## 2021-12-08 DIAGNOSIS — R911 Solitary pulmonary nodule: Secondary | ICD-10-CM | POA: Diagnosis not present

## 2021-12-08 DIAGNOSIS — R351 Nocturia: Secondary | ICD-10-CM

## 2021-12-08 MED ORDER — AMLODIPINE BESYLATE 5 MG PO TABS: 5.0000 mg | ORAL_TABLET | Freq: Every day | ORAL | 3 refills | Status: DC

## 2021-12-08 NOTE — Patient Instructions (Addendum)
Medication Instructions:  Your physician has recommended you make the following change in your medication:  1.) decrease amlodipine to 5 mg - one tablet daily --please keep a check on your blood pressures at home and call if you are consistently getting readings higher than 140/ (top number) or /90 (bottom number)  *If you need a refill on your cardiac medications before your next appointment, please call your pharmacy*   Lab Work: None   Testing/Procedures: none   Follow-Up: As planned in January   Important Information About Sugar

## 2022-05-11 ENCOUNTER — Ambulatory Visit: Payer: Medicare Other

## 2022-05-11 ENCOUNTER — Other Ambulatory Visit (HOSPITAL_COMMUNITY): Payer: Medicare Other

## 2022-05-30 ENCOUNTER — Other Ambulatory Visit (HOSPITAL_COMMUNITY): Payer: Medicare Other
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
## 2022-06-01 ENCOUNTER — Ambulatory Visit (INDEPENDENT_AMBULATORY_CARE_PROVIDER_SITE_OTHER): Payer: Medicare Other | Admitting: Physician Assistant

## 2022-06-01 ENCOUNTER — Ambulatory Visit (HOSPITAL_COMMUNITY): Payer: Medicare Other | Attending: Cardiology

## 2022-06-01 VITALS — BP 130/78 | HR 52 | Ht 71.5 in | Wt 174.8 lb

## 2022-06-01 DIAGNOSIS — R9389 Abnormal findings on diagnostic imaging of other specified body structures: Secondary | ICD-10-CM | POA: Insufficient documentation

## 2022-06-01 DIAGNOSIS — R911 Solitary pulmonary nodule: Secondary | ICD-10-CM | POA: Diagnosis not present

## 2022-06-01 DIAGNOSIS — Z952 Presence of prosthetic heart valve: Secondary | ICD-10-CM

## 2022-06-01 DIAGNOSIS — R197 Diarrhea, unspecified: Secondary | ICD-10-CM | POA: Insufficient documentation

## 2022-06-01 DIAGNOSIS — I10 Essential (primary) hypertension: Secondary | ICD-10-CM

## 2022-06-01 DIAGNOSIS — R351 Nocturia: Secondary | ICD-10-CM

## 2022-06-01 LAB — ECHOCARDIOGRAM COMPLETE
AR max vel: 2.24 cm2
AV Area VTI: 2.45 cm2
AV Area mean vel: 2.21 cm2
AV Mean grad: 13 mmHg
AV Peak grad: 25.2 mmHg
Ao pk vel: 2.51 m/s
Area-P 1/2: 2.62 cm2
S' Lateral: 2.6 cm

## 2022-06-01 NOTE — Patient Instructions (Signed)
Medication Instructions:   Your physician recommends that you continue on your current medications as directed. Please refer to the Current Medication list given to you today.   *If you need a refill on your cardiac medications before your next appointment, please call your pharmacy*   Lab Work:  None ordered.  If you have labs (blood work) drawn today and your tests are completely normal, you will receive your results only by: [place] (if you have MyChart) OR A paper copy in the mail If you have any lab test that is abnormal or we need to change your treatment, we will call you to review the results.   Testing/Procedures:  None ordered.   Follow-Up: At [facility], you and your health needs are our priority.  As part of our continuing mission to provide you with exceptional heart care, we have created designated Provider Care Teams.  These Care Teams include your primary Cardiologist (physician) and Advanced Practice Providers (APPs -  Physician Assistants and Nurse Practitioners) who all work together to provide you with the care you need, when you need it.  We recommend signing up for the patient portal called "MyChart".  Sign up information is provided on this After Visit Summary.  MyChart is used to connect with patients for Virtual Visits (Telemedicine).  Patients are able to view lab/test results, encounter notes, upcoming appointments, etc.  Non-urgent messages can be sent to your provider as well.   To learn more about what you can do with MyChart, go to [email or web address].    Your next appointment:   6 month(s)  Provider:   [name], MD

## 2022-07-08 ENCOUNTER — Other Ambulatory Visit: Payer: Self-pay | Admitting: Physician Assistant

## 2022-08-19 ENCOUNTER — Telehealth: Payer: Self-pay

## 2022-08-19 NOTE — Patient Outreach (Signed)
  Care Coordination   08/19/2022 Name: Rodney Patterson MRN: [id number] DOB: Sep 30, 1932   Care Coordination Outreach Attempts:  An unsuccessful telephone outreach was attempted today to offer the patient information about available care coordination services as a benefit of their health plan.   Follow Up Plan:  Additional outreach attempts will be made to offer the patient care coordination information and services.   Encounter Outcome:  No Answer   Care Coordination Interventions:  No, not indicated    SIG  Dudley Major RN, BSN,CCM, CDE Care Management Coordinator [facility] Care Management [phone]

## 2022-08-23 ENCOUNTER — Telehealth: Payer: Self-pay

## 2022-08-23 NOTE — Patient Outreach (Signed)
  Care Coordination   Initial Visit Note   08/23/2022 Name: Rodney Patterson MRN: [id number] DOB: 1932-11-26  Rodney Patterson is a 89 y.o. year old male who sees Farris Has, MD for primary care. I spoke with  Zipporah Plants by phone today.  What matters to the patients health and wellness today?  I do not need any of your services    Goals Addressed   None     SDOH assessments and interventions completed:  No     Care Coordination Interventions:  No, not indicated   Follow up plan: No further intervention required.   Encounter Outcome:  Pt. Refused  Dudley Major RN, Maximiano Coss, CDE Care Management Coordinator [facility] Care Management [phone]

## 2022-10-31 ENCOUNTER — Ambulatory Visit: Payer: Medicare Other | Attending: Cardiovascular Disease | Admitting: Cardiovascular Disease

## 2022-10-31 ENCOUNTER — Ambulatory Visit (INDEPENDENT_AMBULATORY_CARE_PROVIDER_SITE_OTHER): Payer: Medicare Other

## 2022-10-31 ENCOUNTER — Encounter: Payer: Self-pay | Admitting: Cardiovascular Disease

## 2022-10-31 VITALS — BP 130/90 | HR 46 | Ht 71.5 in | Wt 174.6 lb

## 2022-10-31 DIAGNOSIS — I6523 Occlusion and stenosis of bilateral carotid arteries: Secondary | ICD-10-CM | POA: Diagnosis not present

## 2022-10-31 DIAGNOSIS — I350 Nonrheumatic aortic (valve) stenosis: Secondary | ICD-10-CM | POA: Diagnosis present

## 2022-10-31 DIAGNOSIS — R001 Bradycardia, unspecified: Secondary | ICD-10-CM | POA: Diagnosis not present

## 2022-10-31 DIAGNOSIS — Z952 Presence of prosthetic heart valve: Secondary | ICD-10-CM

## 2022-10-31 DIAGNOSIS — I10 Essential (primary) hypertension: Secondary | ICD-10-CM | POA: Diagnosis not present

## 2022-10-31 MED ORDER — AMLODIPINE BESYLATE 5 MG PO TABS: 5.0000 mg | ORAL_TABLET | Freq: Every day | ORAL | 3 refills | Status: DC

## 2022-10-31 NOTE — Patient Instructions (Addendum)
Medication Instructions:  Decrease amlodipine from 10mg  to 5mg  daily  *If you need a refill on your cardiac medications before your next appointment, please call your pharmacy*   Testing/Procedures: Your physician has requested that you wear a Zio heart monitor for 3 days. This will be mailed to your home with instructions on how to apply the monitor and how to return it when finished. Please allow 2 weeks after returning the heart monitor before our office calls you with the results.   Your physician has requested that you have an echocardiogram in one year before follow up with Dr. Excell Patterson. Echocardiography is a painless test that uses sound waves to create images of your heart. It provides your doctor with information about the size and shape of your heart and how well your heart's chambers and valves are working. This procedure takes approximately one hour. There are no restrictions for this procedure. Please do NOT wear cologne, perfume, aftershave, or lotions (deodorant is allowed). Please arrive 15 minutes prior to your appointment time.    Follow-Up: At [facility], you and your health needs are our priority.  As part of our continuing mission to provide you with exceptional heart care, we have created designated Provider Care Teams.  These Care Teams include your primary Cardiologist (physician) and Advanced Practice Providers (APPs -  Physician Assistants and Nurse Practitioners) who all work together to provide you with the care you need, when you need it.  We recommend signing up for the patient portal called "MyChart".  Sign up information is provided on this After Visit Summary.  MyChart is used to connect with patients for Virtual Visits (Telemedicine).  Patients are able to view lab/test results, encounter notes, upcoming appointments, etc.  Non-urgent messages can be sent to your provider as well.   To learn more about what you can do with MyChart, go to  [email or web address].    Your next appointment:   1 year(s)  Provider:   Tonny Bollman, MD     Other Instructions Rodney Patterson- Long Term Monitor Instructions     Your physician has requested you wear a ZIO patch monitor for 3 days.  This is a single patch monitor. [employer] supplies one patch monitor per enrollment. Additional  stickers are not available. Please do not apply patch if you will be having a Nuclear Stress Test,  Echocardiogram, Cardiac CT, MRI, or Chest Xray during the period you would be wearing the  monitor. The patch cannot be worn during these tests. You cannot remove and re-apply the  ZIO XT patch monitor.  Your ZIO patch monitor will be mailed 3 day USPS to your address on file. It may take 3-5 days  to receive your monitor after you have been enrolled.  Once you have received your monitor, please review the enclosed instructions. Your monitor  has already been registered assigning a specific monitor serial # to you.     Billing and Patient Assistance Program Information     We have supplied [employer] with any of your insurance information on file for billing purposes.  [employer] offers a sliding scale Patient Assistance Program for patients that do not have  insurance, or whose insurance does not completely cover the cost of the ZIO monitor.  You must apply for the Patient Assistance Program to qualify for this discounted rate.  To apply, please call [employer] at [phone], select option 4, select option 2, ask to apply for  Patient Assistance Program. Rodney Patterson will ask your  household income, and how many people  are in your household. They will quote your out-of-pocket cost based on that information.  [employer] will also be able to set up a 74-month, interest-free payment plan if needed.     Applying the monitor     Shave hair from upper left chest.  Hold abrader disc by orange tab. Rub abrader in 40 strokes over the upper left chest as  indicated in your monitor  instructions.  Clean area with 4 enclosed alcohol pads. Let dry.  Apply patch as indicated in monitor instructions. Patch will be placed under collarbone on left  side of chest with arrow pointing upward.  Rub patch adhesive wings for 2 minutes. Remove white label marked "1". Remove the white  label marked "2". Rub patch adhesive wings for 2 additional minutes.  While looking in a mirror, press and release button in center of patch. A small green light will  flash 3-4 times. This will be your only indicator that the monitor has been turned on.  Do not shower for the first 24 hours. You may shower after the first 24 hours.  Press the button if you feel a symptom. You will hear a small click. Record Date, Time and  Symptom in the Patient Logbook.  When you are ready to remove the patch, follow instructions on the last 2 pages of Patient  Logbook. Stick patch monitor onto the last page of Patient Logbook.  Place Patient Logbook in the blue and white box. Use locking tab on box and tape box closed  securely. The blue and white box has prepaid postage on it. Please place it in the mailbox as  soon as possible. Your physician should have your test results approximately 7 days after the  monitor has been mailed back to [facility].  Call [facility] Customer Care at [phone] if you have questions regarding  your ZIO XT patch monitor. Call them immediately if you see an orange light blinking on your  monitor.  If your monitor falls off in less than 4 days, contact our Monitor department at [phone].  If your monitor becomes loose or falls off after 4 days call [employer] at [phone] for  suggestions on securing your monitor.

## 2022-10-31 NOTE — Progress Notes (Unsigned)
Enrolled for [employer] to mail a ZIO XT long term holter monitor to the patients address on file.  

## 2022-10-31 NOTE — Progress Notes (Signed)
Cardiology Office Note:    Date:  10/31/2022   ID:  Rodney Patterson, DOB 02/18/1933, MRN [id number]  PCP:  Farris Has, MD   [facility] Providers Cardiologist:  Tonny Bollman, MD     Referring MD: Farris Has, MD   Chief Complaint  Patient presents with   Aortic Stenosis    History of Present Illness:    Rodney Patterson is a 89 y.o. male with a hx of prostate CA s/p seed XRT, HLD, HTN, CKD stage II, CAD and critical AS s/p TAVR (05/11/21) who presents to clinic for follow up.   The patient is here with his wife today. He is doing well. Plans on going quail hunting this Fall in [place]. Today, he denies symptoms of palpitations, chest pain, shortness of breath, orthopnea, PND, lower extremity edema, or syncope.  He does have occasional dizziness.  He brings in home blood pressure and pulse readings with heart rates consistently above 50 bpm and blood pressures ranging from about 100 to 120 mmHg over 70s.  States that he is supposed to be taking 5 mg of amlodipine but he has been taking 10 mg daily.   Past Medical History:  Diagnosis Date   Bilateral tinnitus    Carotid stenosis, bilateral    per duplex 02-11-2016  bilateral ICA less than50% stenosis   CKD (chronic kidney disease) stage 3, GFR 30-59 ml/min (HCC)    Coronary artery disease    Dyspnea    Erectile dysfunction    History of hypertension    per pt pcp took him off medication approx. 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 Excell Seltzer and Dr. Laneta Simmers (TAVR)   Wears  glasses    Wears partial dentures    upper   White coat syndrome without diagnosis of hypertension     Past Surgical History:  Procedure Laterality Date   CATARACT EXTRACTION W/ INTRAOCULAR LENS  IMPLANT, BILATERAL  2016 approx.   COLONOSCOPY  last one 02-17-2004   GOLD SEED IMPLANT N/A 12/27/2016   Procedure: GOLD SEED IMPLANT WITH SPACE OAR;  Surgeon: Bjorn Pippin, MD;  Location: [facility];  Service: Urology;  Laterality: N/A;   INTRAOPERATIVE TRANSTHORACIC ECHOCARDIOGRAM Left 05/11/2021   Procedure: INTRAOPERATIVE TRANSTHORACIC ECHOCARDIOGRAM;  Surgeon: Tonny Bollman, MD;  Location: [facility] OR;  Service: Open Heart Surgery;  Laterality: Left;   LAPAROSCOPIC CHOLECYSTECTOMY  01-18-2003  dr Jamey Ripa   PROSTATE BIOPSY  10-26-2016  dr Annabell Howells (office)   RIGHT HEART CATH AND CORONARY ANGIOGRAPHY N/A 04/29/2021   Procedure: RIGHT HEART CATH AND CORONARY ANGIOGRAPHY;  Surgeon: Tonny Bollman, MD;  Location: [facility] INVASIVE CV LAB;  Service: Cardiovascular;  Laterality: N/A;   TRANSCATHETER AORTIC VALVE REPLACEMENT, TRANSFEMORAL Bilateral 05/11/2021   Procedure: TRANSCATHETER AORTIC VALVE REPLACEMENT, TRANSFEMORAL;  Surgeon: Tonny Bollman, MD;  Location: [facility] OR;  Service: Open Heart Surgery;  Laterality: Bilateral;   ULTRASOUND GUIDANCE FOR VASCULAR ACCESS Bilateral 05/11/2021   Procedure: ULTRASOUND GUIDANCE FOR VASCULAR ACCESS;  Surgeon:  Tonny Bollman, MD;  Location: [facility] OR;  Service: Open Heart Surgery;  Laterality: Bilateral;    Current Medications: Current Meds  Medication Sig   acetaminophen (TYLENOL) 500 MG tablet Take 500 mg by mouth every 6 (six) hours as needed for moderate pain.   amoxicillin (AMOXIL) 500 MG tablet Take 4 tablets (2,000 mg total) by mouth as directed. 1 hour prior to dental work including cleanings   Ascorbic Acid (VITAMIN C) 1000 MG tablet Take 1,000 mg by mouth daily. Per patient taking in the winter months   aspirin 81 MG tablet Take 1 tablet (81 mg total)  by mouth daily.   betamethasone dipropionate 0.05 % cream Apply 1 application topically 2 (two) times daily as needed (itching/rash).   cetirizine (ZYRTEC) 10 MG tablet TAKE 1 TABLET BY MOUTH EVERY DAY   Cholecalciferol (VITAMIN D3) 5000 units CAPS Take 5,000 Units by mouth every morning.   dorzolamide (TRUSOPT) 2 % ophthalmic solution Place 1 drop into both eyes 2 (two) times daily.   loperamide (IMODIUM) 2 MG capsule Take 2 mg by mouth daily as needed for diarrhea or loose stools.   methylcellulose (CITRUCEL) oral powder Take 2-3 teaspoonful oral daily   Omega-3 1000 MG CAPS Take 1,000 mg by mouth daily.   OVER THE COUNTER MEDICATION Take 1 tablet by mouth daily. [employer]   pravastatin (PRAVACHOL) 20 MG tablet Take 20 mg by mouth every other day.   Propylene Glycol (SYSTANE COMPLETE) 0.6 % SOLN Place 1 drop into both eyes daily.   tadalafil (CIALIS) 20 MG tablet Take 1 tablet (20 mg total) by mouth daily as needed for erectile dysfunction.   [DISCONTINUED] amLODipine (NORVASC) 5 MG tablet Take 1 tablet (5 mg total) by mouth daily.     Allergies:   Honey bee venom [bee venom], Adhesive [tape], Rosuvastatin calcium, and Tetanus immune globulin   Social History   Socioeconomic History   Marital status: Married    Spouse name: Not on file   Number of children: Not on file   Years of education: Not on file   Highest education level: Not on file  Occupational History   Not on file  Tobacco Use   Smoking status: Former    Types: Cigars, Pipe    Quit date: 05/03/1967    Years since quitting: 55.5   Smokeless tobacco: Never  Vaping Use   Vaping Use: Never used  Substance and Sexual Activity   Alcohol use: Not Currently   Drug use: No   Sexual activity: Not on file  Other Topics Concern   Not on file  Social History Narrative   Not on file   Social Determinants of Health   Financial Resource Strain: Not on file  Food Insecurity: Not on file  Transportation Needs: Not on  file  Physical Activity: Not on file  Stress: Not on file  Social Connections: Not on file     Family History: The patient's family history includes Heart attack in his mother. There is no history of Cancer, Esophageal cancer, or Colon cancer.  ROS:   Please see the history of present illness.    Positive for hard of hearing and nocturia, otherwise all other systems reviewed and are negative.  EKGs/Labs/Other Studies Reviewed:    The following studies were reviewed today: Cardiac Studies & Procedures   CARDIAC CATHETERIZATION  CARDIAC CATHETERIZATION 04/29/2021  Narrative   Prox RCA lesion is 30% stenosed.   Prox LAD lesion is 40% stenosed.  1st Diag lesion is 70% stenosed.   2nd Diag lesion is 90% stenosed.   1st Mrg lesion is 100% stenosed.   There is severe aortic valve stenosis.  Patent left main, LAD, dominant RCA, and large intermediate branches with mild nonobstructive disease Severe diagonal stenosis, small to moderate caliber vessels Chronic occlusion of a small OM1 branch Known critical aortic stenosis with heavy calcification and restriction of the aortic valve on fluoroscopy Normal right heart pressures  Plan: medical therapy for CAD, continue TAVR evaluation  Findings Coronary Findings Diagnostic  Dominance: Right  Left Main The vessel exhibits minimal luminal irregularities.  Left Anterior Descending The vessel exhibits minimal luminal irregularities. Prox LAD lesion is 40% stenosed.  First Diagonal Branch 1st Diag lesion is 70% stenosed.  Second Diagonal Branch 2nd Diag lesion is 90% stenosed.  Ramus Intermedius Vessel is large. The vessel exhibits minimal luminal irregularities.  Left Circumflex  First Obtuse Marginal Branch 1st Mrg lesion is 100% stenosed. CTO, small vessel  Right Coronary Artery Vessel is large. The vessel exhibits minimal luminal irregularities. Prox RCA lesion is 30% stenosed.  Intervention  No interventions  have been documented.     ECHOCARDIOGRAM  ECHOCARDIOGRAM COMPLETE 06/01/2022  Narrative ECHOCARDIOGRAM REPORT    Patient Name:   Rodney Patterson Date of Exam: 06/01/2022 Medical Rec #:  [id number]       Height:       71.5 in Accession #:    [id number]      Weight:       180.0 lb Date of Birth:  [birth year]      BSA:          2.027 m Patient Age:    89 years        BP:           130/70 mmHg Patient Gender: M               HR:           47 bpm. Exam Location:  [address]  Procedure: 2D Echo, Cardiac Doppler, Color Doppler and 3D Echo  Indications:    1 year S/P TAVR Z95.2  History:        Patient has prior history of Echocardiogram examinations, most recent 06/09/2021. CAD; Risk Factors:Dyslipidemia. Aortic Valve: 26 mm Edwards S3UR valve is present in the aortic position. Procedure Date: 05/11/2021.  Sonographer:    Thurman Coyer RDCS Referring Phys: [id number] KATHRYN R THOMPSON  IMPRESSIONS   1. Left ventricular ejection fraction, by estimation, is 60 to 65%. Left ventricular ejection fraction by 3D volume is 63 %. The left ventricle has normal function. The left ventricle has no regional wall motion abnormalities. There is mild left ventricular hypertrophy. Left ventricular diastolic parameters are consistent with Grade I diastolic dysfunction (impaired relaxation). 2. Right ventricular systolic function is normal. The right ventricular size is normal. 3. The mitral valve is normal in structure. No evidence of mitral valve regurgitation. No evidence of mitral stenosis. Moderate mitral annular calcification. 4. The aortic valve has been repaired/replaced. Aortic valve regurgitation is not visualized. Mild aortic valve stenosis. There is a 26 mm Edwards S3UR valve present in the aortic position. Procedure Date: 05/11/2021. Echo findings are consistent with mild perivalvular leak of the aortic prosthesis. Aortic valve area, by VTI measures 2.45 cm. Aortic valve mean gradient  measures 13.0 mmHg. Aortic valve Vmax measures 2.51 m/s. 5. Aortic dilatation noted. There is mild dilatation of the ascending aorta, measuring 40 mm. 6.  The inferior vena cava is normal in size with greater than 50% respiratory variability, suggesting right atrial pressure of 3 mmHg.  Comparison(s): No significant change from prior study. Prior images reviewed side by side.  FINDINGS Left Ventricle: Left ventricular ejection fraction, by estimation, is 60 to 65%. Left ventricular ejection fraction by 3D volume is 63 %. The left ventricle has normal function. The left ventricle has no regional wall motion abnormalities. The left ventricular internal cavity size was normal in size. There is mild left ventricular hypertrophy. Left ventricular diastolic parameters are consistent with Grade I diastolic dysfunction (impaired relaxation).  Right Ventricle: The right ventricular size is normal. No increase in right ventricular wall thickness. Right ventricular systolic function is normal.  Left Atrium: Left atrial size was normal in size.  Right Atrium: Right atrial size was normal in size.  Pericardium: There is no evidence of pericardial effusion.  Mitral Valve: The mitral valve is normal in structure. Moderate mitral annular calcification. No evidence of mitral valve regurgitation. No evidence of mitral valve stenosis.  Tricuspid Valve: The tricuspid valve is normal in structure. Tricuspid valve regurgitation is not demonstrated. No evidence of tricuspid stenosis.  Aortic Valve: The aortic valve has been repaired/replaced. Aortic valve regurgitation is mild. No aortic stenosis is present. Aortic valve mean gradient measures 13.0 mmHg. Aortic valve peak gradient measures 25.2 mmHg. Aortic valve area, by VTI measures 2.45 cm. There is a 26 mm Edwards S3UR valve present in the aortic position. Procedure Date: 05/11/2021.  Pulmonic Valve: The pulmonic valve was normal in structure. Pulmonic valve  regurgitation is not visualized. No evidence of pulmonic stenosis.  Aorta: Aortic dilatation noted. There is mild dilatation of the ascending aorta, measuring 40 mm.  Venous: The inferior vena cava is normal in size with greater than 50% respiratory variability, suggesting right atrial pressure of 3 mmHg.  IAS/Shunts: No atrial level shunt detected by color flow Doppler.   LEFT VENTRICLE PLAX 2D LVIDd:         3.70 cm         Diastology LVIDs:         2.60 cm         LV e' medial:    7.40 cm/s LV PW:         1.10 cm         LV E/e' medial:  12.8 LV IVS:        1.20 cm         LV e' lateral:   9.46 cm/s LVOT diam:     2.60 cm         LV E/e' lateral: 10.0 LV SV:         150 LV SV Index:   74 LVOT Area:     5.31 cm        3D Volume EF LV 3D EF:    Left ventricul ar ejection fraction by 3D volume is 63 %.  3D Volume EF: 3D EF:        63 % LV EDV:       128 ml LV ESV:       48 ml LV SV:        80 ml  RIGHT VENTRICLE RV S prime:     10.80 cm/s TAPSE (M-mode): 2.6 cm  LEFT ATRIUM             Index        RIGHT ATRIUM  Index LA diam:        4.20 cm 2.07 cm/m   RA Area:     22.40 cm LA Vol (A2C):   44.1 ml 21.76 ml/m  RA Volume:   66.90 ml  33.01 ml/m LA Vol (A4C):   37.6 ml 18.55 ml/m LA Biplane Vol: 42.1 ml 20.77 ml/m AORTIC VALVE AV Area (Vmax):    2.24 cm AV Area (Vmean):   2.21 cm AV Area (VTI):     2.45 cm AV Vmax:           251.00 cm/s AV Vmean:          165.000 cm/s AV VTI:            0.614 m AV Peak Grad:      25.2 mmHg AV Mean Grad:      13.0 mmHg LVOT Vmax:         106.00 cm/s LVOT Vmean:        68.600 cm/s LVOT VTI:          0.283 m LVOT/AV VTI ratio: 0.46  AORTA Ao Root diam: 3.50 cm Ao Asc diam:  4.00 cm  MITRAL VALVE                TRICUSPID VALVE MV Area (PHT): 2.62 cm     TR Peak grad:   23.6 mmHg MV Decel Time: 290 msec     TR Vmax:        243.00 cm/s MV E velocity: 94.90 cm/s MV A velocity: 147.00 cm/s  SHUNTS MV E/A  ratio:  0.65         Systemic VTI:  0.28 m Systemic Diam: 2.60 cm  Donato Schultz MD Electronically signed by Donato Schultz MD Signature Date/Time: 06/01/2022/4:07:21 PM    Final     CT SCANS  CT CORONARY MORPH W/CTA COR W/SCORE 04/22/2021  Addendum 04/22/2021  7:25 PM ADDENDUM REPORT: 04/22/2021 19:23  CLINICAL DATA:  11W with severe aortic stenosis being evaluated for a TAVR procedure.  EXAM: Cardiac TAVR CT  TECHNIQUE: The patient was scanned on a [employer]. A 120 kV retrospective scan was triggered in the descending thoracic aorta at 111 HU's. Gantry rotation speed was 250 msecs and collimation was .6 mm. No beta blockade or nitro were given. The 3D data set was reconstructed in 5% intervals of the R-R cycle. Systolic and diastolic phases were analyzed on a dedicated work station using MPR, MIP and VRT modes. The patient received 80 cc of contrast.  FINDINGS: Aortic Root:  Aortic valve: Tricuspid  Aortic valve calcium score: 4635  Aortic annulus:  Diameter: 30mm x 22mm  Perimeter: 82mm  Area: 503 mm^2  Calcifications: Moderate calcification adjacent to noncoronary cusp  Coronary height: Min Left - 21mm, Max Left - 28mm; Min Right - 19mm  Sinotubular height: Left cusp - 28mm; Right cusp - 23mm; Noncoronary cusp - 21mm  LVOT (as measured 3 mm below the annulus):  Diameter: 32mm x 19mm  Area: 463 mm^2  Calcifications: Calcification inferior to noncoronary cusp  Aortic sinus width: Left cusp - 33mm; Right cusp - 33mm; Noncoronary cusp - 34mm  Sinotubular junction width: 31mm x 30mm  Optimum Fluoroscopic Angle for Delivery: LAO 4 CAU 24  Cardiac:  Right atrium: Mild enlargement  Right ventricle: Mild enlargement  Pulmonary arteries: Normal size  Pulmonary veins: Normal configuration  Left atrium: Mild enlargement  Left ventricle: Normal size  Pericardium: Normal thickness  Coronary arteries: Coronary calcium  score  389  IMPRESSION: 1. Trileafet aortic valve with severe calcifications (AV calcium score 4635)  2. Aortic annulus measures 30mm x 22mm in diameter with perimeter 82mm and area 503 mm^2. Moderate annular calcification adjacent to noncoronary cusp and extending into LVOT. Annular measurements suitable for delivery of 26mm Edwards Sapien 3 valve  3. Sufficient coronary to annulus distance, measuring 21mm to left main and 19mm to RCA  4. Optimum Fluoroscopic Angle for Delivery:  LAO 4 CAU 24  5. Coronary calcium score 389   Electronically Signed By: Epifanio Lesches M.D. On: 04/22/2021 19:23  Narrative EXAM: OVER-READ INTERPRETATION  CT CHEST  The following report is an over-read performed by radiologist Dr. Cleone Slim of [facility] on 04/22/2021. This over-read does not include interpretation of cardiac or coronary anatomy or pathology. The coronary CTA interpretation by the cardiologist is attached.  COMPARISON:  None.  FINDINGS: Please see the separate concurrent chest CT angiogram report for details.  IMPRESSION: Please see the separate concurrent chest CT angiogram report for details.  Electronically Signed: By: Delbert Phenix M.D. On: 04/22/2021 14:29           EKG:        Recent Labs: No results found for requested labs within last 365 days.  Recent Lipid Panel No results found for: "CHOL", "TRIG", "HDL", "CHOLHDL", "VLDL", "LDLCALC", "LDLDIRECT"   Risk Assessment/Calculations:            Physical Exam:    VS:  BP (!) 130/90   Pulse (!) 46   Ht 5' 11.5" (1.816 m)   Wt 174 lb 9.6 oz (79.2 kg)   SpO2 98%   BMI 24.01 kg/m     Wt Readings from Last 3 Encounters:  10/31/22 174 lb 9.6 oz (79.2 kg)  06/01/22 174 lb 12.8 oz (79.3 kg)  12/08/21 180 lb (81.6 kg)     GEN:  Well nourished, well developed in no acute distress HEENT: Normal, hard of hearing NECK: No JVD; No carotid bruits LYMPHATICS: No lymphadenopathy CARDIAC:  bradycardic and regular, 2/6 systolic murmur at the RUSBRESPIRATORY:  Clear to auscultation without rales, wheezing or rhonchi  ABDOMEN: Soft, non-tender, non-distended MUSCULOSKELETAL:  No edema; No deformity  SKIN: Warm and dry NEUROLOGIC:  Alert and oriented x 3 PSYCHIATRIC:  Normal affect   ASSESSMENT:    1. Carotid stenosis, bilateral   2. S/P TAVR (transcatheter aortic valve replacement)   3. Essential hypertension   4. Bradycardia   5. Severe aortic stenosis    PLAN:    In order of problems listed above:  Asymptomatic, less than 50% stenosis bilaterally.  Continue medical therapy with aspirin and a statin drug. Most recent echo reviewed from January 2024 showing normal transcatheter aortic valve function.  Follows SBE prophylaxis as indicated.  Repeat echocardiogram next year prior to his visit. Reports that he has been taking amlodipine 10 mg.  Home blood pressures are reviewed with a blood pressure of 103 to 119 mmHg over 70 to 78 mmHg.  He has had episodic dizziness.  Will reduce amlodipine to 5 mg daily. Patient with a heart rate of 46 bpm on EKG.  Will advise 3-day ZIO monitor.  Sinus bradycardia noted but want to exclude higher grade conduction disease.     Medication Adjustments/Labs and Tests Ordered: Current medicines are reviewed at length with the patient today.  Concerns regarding medicines are outlined above.  Orders Placed This Encounter  Procedures   LONG TERM MONITOR (3-14  DAYS)   EKG 12-Lead   ECHOCARDIOGRAM COMPLETE   Meds ordered this encounter  Medications   amLODipine (NORVASC) 5 MG tablet    Sig: Take 1 tablet (5 mg total) by mouth daily.    Dispense:  90 tablet    Refill:  3    Dose decrease    Patient Instructions  Medication Instructions:  Decrease amlodipine from 10mg  to 5mg  daily  *If you need a refill on your cardiac medications before your next appointment, please call your pharmacy*   Testing/Procedures: Your physician has  requested that you wear a Zio heart monitor for 3 days. This will be mailed to your home with instructions on how to apply the monitor and how to return it when finished. Please allow 2 weeks after returning the heart monitor before our office calls you with the results.   Your physician has requested that you have an echocardiogram in one year before follow up with Dr. Excell Seltzer. Echocardiography is a painless test that uses sound waves to create images of your heart. It provides your doctor with information about the size and shape of your heart and how well your heart's chambers and valves are working. This procedure takes approximately one hour. There are no restrictions for this procedure. Please do NOT wear cologne, perfume, aftershave, or lotions (deodorant is allowed). Please arrive 15 minutes prior to your appointment time.    Follow-Up: At [facility], you and your health needs are our priority.  As part of our continuing mission to provide you with exceptional heart care, we have created designated Provider Care Teams.  These Care Teams include your primary Cardiologist (physician) and Advanced Practice Providers (APPs -  Physician Assistants and Nurse Practitioners) who all work together to provide you with the care you need, when you need it.  We recommend signing up for the patient portal called "MyChart".  Sign up information is provided on this After Visit Summary.  MyChart is used to connect with patients for Virtual Visits (Telemedicine).  Patients are able to view lab/test results, encounter notes, upcoming appointments, etc.  Non-urgent messages can be sent to your provider as well.   To learn more about what you can do with MyChart, go to [email or web address].    Your next appointment:   1 year(s)  Provider:   Tonny Bollman, MD     Other Instructions Christena Deem- Long Term Monitor Instructions     Your physician has requested you wear a ZIO patch monitor for 3 days.   This is a single patch monitor. [employer] supplies one patch monitor per enrollment. Additional  stickers are not available. Please do not apply patch if you will be having a Nuclear Stress Test,  Echocardiogram, Cardiac CT, MRI, or Chest Xray during the period you would be wearing the  monitor. The patch cannot be worn during these tests. You cannot remove and re-apply the  ZIO XT patch monitor.  Your ZIO patch monitor will be mailed 3 day USPS to your address on file. It may take 3-5 days  to receive your monitor after you have been enrolled.  Once you have received your monitor, please review the enclosed instructions. Your monitor  has already been registered assigning a specific monitor serial # to you.     Billing and Patient Assistance Program Information     We have supplied [employer] with any of your insurance information on file for billing purposes.  [employer] offers a sliding scale Patient Assistance Program  for patients that do not have  insurance, or whose insurance does not completely cover the cost of the ZIO monitor.  You must apply for the Patient Assistance Program to qualify for this discounted rate.  To apply, please call [employer] at [phone], select option 4, select option 2, ask to apply for  Patient Assistance Program. [name] will ask your household income, and how many people  are in your household. They will quote your out-of-pocket cost based on that information.  [employer] will also be able to set up a 77-month, interest-free payment plan if needed.     Applying the monitor     Shave hair from upper left chest.  Hold abrader disc by orange tab. Rub abrader in 40 strokes over the upper left chest as  indicated in your monitor instructions.  Clean area with 4 enclosed alcohol pads. Let dry.  Apply patch as indicated in monitor instructions. Patch will be placed under collarbone on left  side of chest with arrow pointing upward.  Rub patch adhesive wings for 2  minutes. Remove white label marked "1". Remove the white  label marked "2". Rub patch adhesive wings for 2 additional minutes.  While looking in a mirror, press and release button in center of patch. A small green light will  flash 3-4 times. This will be your only indicator that the monitor has been turned on.  Do not shower for the first 24 hours. You may shower after the first 24 hours.  Press the button if you feel a symptom. You will hear a small click. Record Date, Time and  Symptom in the Patient Logbook.  When you are ready to remove the patch, follow instructions on the last 2 pages of Patient  Logbook. Stick patch monitor onto the last page of Patient Logbook.  Place Patient Logbook in the blue and white box. Use locking tab on box and tape box closed  securely. The blue and white box has prepaid postage on it. Please place it in the mailbox as  soon as possible. Your physician should have your test results approximately 7 days after the  monitor has been mailed back to [facility].  Call [facility] Customer Care at [phone] if you have questions regarding  your ZIO XT patch monitor. Call them immediately if you see an orange light blinking on your  monitor.  If your monitor falls off in less than 4 days, contact our Monitor department at [phone].  If your monitor becomes loose or falls off after 4 days call [employer] at [phone] for  suggestions on securing your monitor.     Signed, Tonny Bollman, MD  10/31/2022 5:20 PM    [facility]

## 2022-11-06 DIAGNOSIS — R001 Bradycardia, unspecified: Secondary | ICD-10-CM

## 2022-12-06 ENCOUNTER — Other Ambulatory Visit: Payer: Self-pay | Admitting: Physician Assistant

## 2023-02-20 ENCOUNTER — Other Ambulatory Visit: Payer: Self-pay | Admitting: Cardiovascular Disease

## 2023-06-10 IMAGING — PT NM PET TUM IMG SKULL BASE T - THIGH
1 series · 5 of 5 positions shown · non-contrast
Comparison: CTA chest abdomen and pelvis of 04/22/2021. Axumin PET
12/06/2016

CLINICAL DATA: Radiation done 4 years ago for prostate cancer. PSA
of 0.9.

EXAM:
NUCLEAR MEDICINE PET SKULL BASE TO THIGH
TECHNIQUE: 9.0 mCi F18 Piflufolastat (Pylarify) was injected intravenously.
Full-ring PET imaging was performed from the skull base to thigh
after the radiotracer. CT data was obtained and used for attenuation
correction and anatomic localization.

[Series 1092: results mm oncology reading · 3.0mm · 0.94mm/px · 5 of 5 slices shown]
[im 1/5]
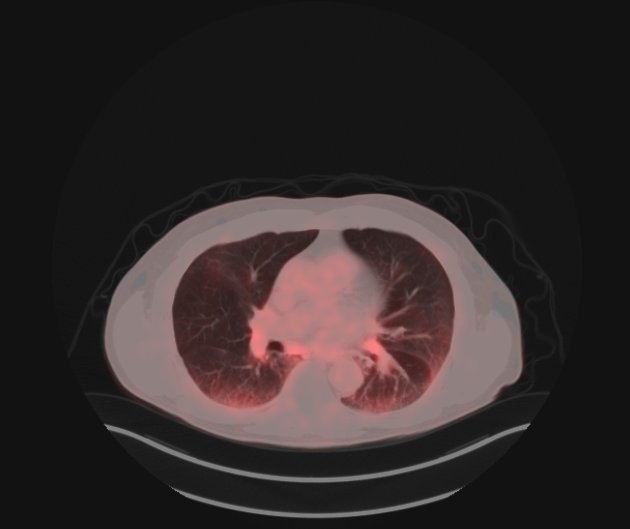
[im 2/5]
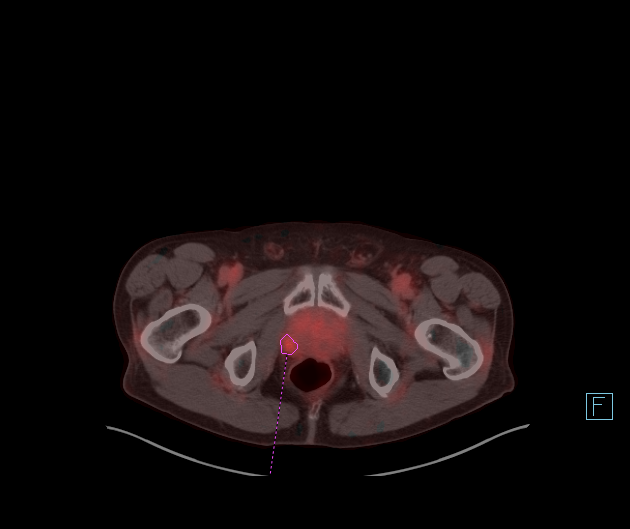
[im 3/5]
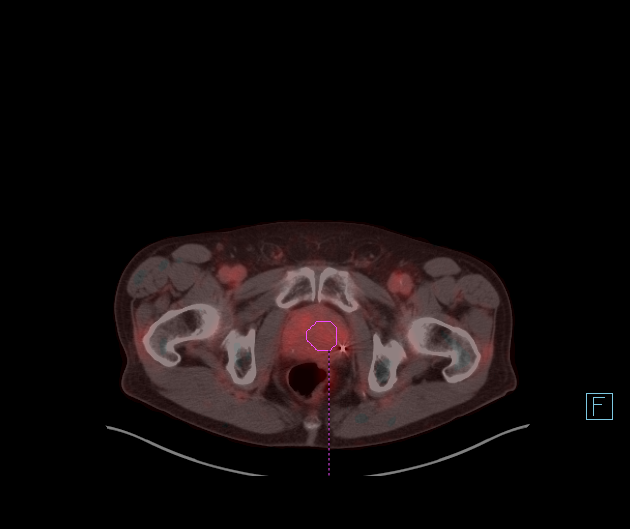
[im 4/5]
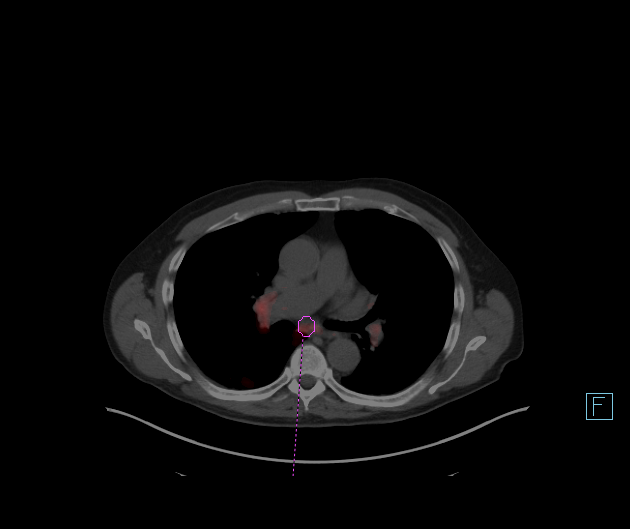
[im 5/5]
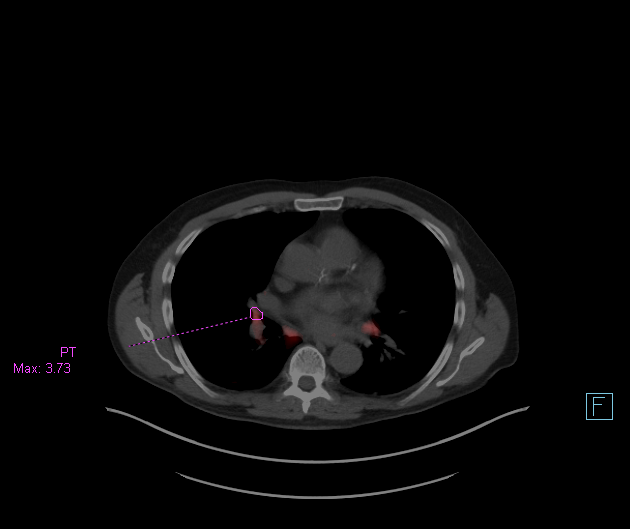

[5 of 5 positions shown; findings below may reference images not displayed]

FINDINGS: NECK

No radiotracer activity in neck lymph nodes.

Incidental CT finding: Hypoattenuation within the right frontal lobe
including at up to 2.9 cm on [DATE]. This area was excluded from the
12/06/2016 PET, and no prior intracranial imaging is available. No
cervical adenopathy. Bilateral carotid atherosclerosis.

CHEST

Mild tracer affinity corresponding to prominent nodes on prior CTA.
Example subcarinal node at 9 mm and a S.U.V. max of 3.2 on 96/4.

Bilateral infrahilar nodal tracer affinity, including at a S.U.V.
max of 3.7 on the right.

Low-level hypermetabolism corresponding to dependent lower lobe
predominant interstitial thickening which is grossly similar to
04/22/2021 CT.

Incidental CT finding: Aortic and coronary artery calcification.
Interval TAVR.

ABDOMEN/PELVIS

Prostate: Within the lateral right prostatic apex, mild tracer
affinity at a S.U.V. max of 3.6. This compares to the remainder of
the prostate, which measures on the order of a S.U.V. max of 2.8.

Lymph nodes: No abnormal radiotracer accumulation within pelvic or
abdominal nodes.

Liver: No evidence of liver metastasis

Incidental CT finding: Normal adrenal glands. Cholecystectomy. Mild
renal cortical thinning bilaterally. Normal noncontrast appearance
of the liver, pancreas, spleen. The bladder may be mildly thick
walled but is underdistended. Moderate prostatomegaly. Radiation
seeds in the prostate. Scattered colonic diverticula.

SKELETON

No focal  activity to suggest skeletal metastasis.
IMPRESSION: 1. Mild tracer affinity within the lateral right prostatic apex.
Cannot exclude residual disease in this area.
2. No evidence of nodal or osseous metastasis.
3. Pulmonary parenchymal low-level tracer affinity, corresponding to
interstitial lung disease on 04/22/2021 CTA. Please see that report.
4. Thoracic nodes with low-level tracer affinity, favored to be
reactive.
5. Right frontal lobe area of hypoattenuation is nonspecific and
suboptimally evaluated. Considerations include mass lesion or prior
infarct. Correlate with clinical history and consider dedicated pre
and post contrast brain MR.
6. Incidental findings, including: Coronary artery atherosclerosis.
Aortic Atherosclerosis ([id number].E).

## 2023-09-20 ENCOUNTER — Other Ambulatory Visit: Payer: Self-pay | Admitting: Cardiovascular Disease

## 2023-10-31 ENCOUNTER — Ambulatory Visit (HOSPITAL_COMMUNITY)
Admission: RE | Admit: 2023-10-31 | Discharge: 2023-10-31 | Disposition: A | Discharge status: Home / Self Care | Payer: BLUE CROSS/BLUE SHIELD | Source: Ambulatory Visit | Attending: Cardiology | Admitting: Cardiology

## 2023-10-31 DIAGNOSIS — I350 Nonrheumatic aortic (valve) stenosis: Secondary | ICD-10-CM

## 2023-10-31 LAB — ECHOCARDIOGRAM COMPLETE
AR max vel: 1.17 cm2
AV Area VTI: 1.45 cm2
AV Area mean vel: 1.26 cm2
AV Mean grad: 9 mmHg
AV Peak grad: 18.7 mmHg
Ao pk vel: 2.16 m/s
Area-P 1/2: 1.73 cm2
P 1/2 time: 387 ms
S' Lateral: 2.4 cm

## 2023-11-01 ENCOUNTER — Ambulatory Visit: Payer: Self-pay | Admitting: Cardiovascular Disease

## 2023-11-08 ENCOUNTER — Telehealth: Payer: Self-pay | Admitting: Cardiovascular Disease

## 2023-11-08 MED ORDER — AMLODIPINE BESYLATE 5 MG PO TABS: 5.0000 mg | ORAL_TABLET | Freq: Every day | ORAL | 0 refills | Status: DC

## 2023-11-08 NOTE — Telephone Encounter (Signed)
*  STAT* If patient is at the pharmacy, call can be transferred to refill team.   1. Which medications need to be refilled? (please list name of each medication and dose if known) amLODipine  (NORVASC ) 5 MG tablet    4. Which pharmacy/location (including street and city if local pharmacy) is medication to be sent to? [id number] - [place], [address] - [address]    5. Do they need a 30 day or 90 day supply? 90     Scheduled 8/20

## 2023-11-08 NOTE — Telephone Encounter (Signed)
 Pt's medication was sent to pt's pharmacy as requested. Confirmation received.

## 2023-12-17 ENCOUNTER — Other Ambulatory Visit: Payer: Self-pay | Admitting: Cardiovascular Disease

## 2023-12-20 ENCOUNTER — Encounter: Payer: Self-pay | Admitting: Cardiovascular Disease

## 2023-12-20 ENCOUNTER — Ambulatory Visit: Attending: Cardiology | Admitting: Cardiovascular Disease

## 2023-12-20 VITALS — BP 122/78 | HR 55 | Resp 16 | Ht 71.0 in | Wt 183.0 lb

## 2023-12-20 DIAGNOSIS — Z952 Presence of prosthetic heart valve: Secondary | ICD-10-CM | POA: Diagnosis not present

## 2023-12-20 DIAGNOSIS — I10 Essential (primary) hypertension: Secondary | ICD-10-CM | POA: Insufficient documentation

## 2023-12-20 NOTE — Assessment & Plan Note (Signed)
 Patient clinically stable/asymptomatic post TAVR.  He has mild to moderate paravalvular regurgitation with no change in LV function, normal diastolic blood pressure on exam, and a very soft murmur, most suggestive of only mild aortic insufficiency.  Recommend repeat echocardiogram next year.  He follows SBE prophylaxis as recommended.

## 2023-12-20 NOTE — Progress Notes (Signed)
 Cardiology Office Note:    Date:  12/20/2023   ID:  Rodney Patterson, Rodney Patterson 01/08/1933, MRN [id number]  PCP:  Kip Righter, MD    HeartCare Providers Cardiologist:  Ozell Fell, MD     Referring MD: Kip Righter, MD   Chief Complaint  Patient presents with   Follow-up    S/P TAVR    History of Present Illness:    Rodney Patterson is a 89 y.o. male with a hx of aortic stenosis status post TAVR in 2023, presenting for follow-up evaluation today.  He has a history of hypertension, hyperlipidemia, stage II chronic kidney disease, and prostate cancer.  The patient is here with his wife today.  He is clinically doing well with no chest pain, chest pressure, or shortness of breath.  He has had some problems with his prostate cancer treatment after undergoing radiation therapy.  He complains of bladder incontinence, loose stools, and erectile dysfunction.  He has undergone some treatments but without great success.  Otherwise he has no cardiac-related complaints.  He recently had an echocardiogram for TAVR surveillance, demonstrating a mean transvalvular gradient of 9 mmHg and mild to moderate paravalvular regurgitation.   Current Medications: Current Meds  Medication Sig   acetaminophen  (TYLENOL ) 500 MG tablet Take 500 mg by mouth every 6 (six) hours as needed for moderate pain.   amLODipine  (NORVASC ) 5 MG tablet Take 1 tablet (5 mg total) by mouth daily.   amoxicillin  (AMOXIL ) 500 MG tablet Take 4 tablets (2,000 mg total) by mouth as directed. 1 hour prior to dental work including cleanings   Ascorbic Acid  (VITAMIN C) 1000 MG tablet Take 1,000 mg by mouth daily. Per patient taking in the winter months   aspirin  81 MG tablet Take 1 tablet (81 mg total) by mouth daily.   betamethasone dipropionate 0.05 % cream Apply 1 application topically 2 (two) times daily as needed (itching/rash).   cetirizine  (ZYRTEC ) 10 MG tablet TAKE 1 TABLET BY MOUTH EVERY DAY   Cholecalciferol  (VITAMIN D3) 5000 units CAPS Take 5,000 Units by mouth every morning.   dorzolamide (TRUSOPT) 2 % ophthalmic solution Place 1 drop into both eyes 2 (two) times daily.   loperamide  (IMODIUM ) 2 MG capsule Take 2 mg by mouth daily as needed for diarrhea or loose stools.   methylcellulose (CITRUCEL) oral powder Take 2-3 teaspoonful oral daily   Omega-3 1000 MG CAPS Take 1,000 mg by mouth daily.   OVER THE COUNTER MEDICATION Take 1 tablet by mouth daily. [employer]   pravastatin  (PRAVACHOL ) 20 MG tablet Take 20 mg by mouth every other day.   Propylene Glycol (SYSTANE COMPLETE) 0.6 % SOLN Place 1 drop into both eyes daily.   tadalafil  (CIALIS ) 20 MG tablet Take 1 tablet (20 mg total) by mouth daily as needed for erectile dysfunction.     Allergies:   Honey bee venom [bee venom], Adhesive [tape], Rosuvastatin calcium, and Tetanus immune globulin   ROS:   Please see the history of present illness.    All other systems reviewed and are negative.  EKGs/Labs/Other Studies Reviewed:    The following studies were reviewed today: Cardiac Studies & Procedures   ______________________________________________________________________________________________ CARDIAC CATHETERIZATION  CARDIAC CATHETERIZATION 04/29/2021  Conclusion   Prox RCA lesion is 30% stenosed.   Prox LAD lesion is 40% stenosed.   1st Diag lesion is 70% stenosed.   2nd Diag lesion is 90% stenosed.   1st Mrg lesion is 100% stenosed.   There  is severe aortic valve stenosis.  Patent left main, LAD, dominant RCA, and large intermediate branches with mild nonobstructive disease Severe diagonal stenosis, small to moderate caliber vessels Chronic occlusion of a small OM1 branch Known critical aortic stenosis with heavy calcification and restriction of the aortic valve on fluoroscopy Normal right heart pressures  Plan: medical therapy for CAD, continue TAVR evaluation  Findings Coronary Findings Diagnostic  Dominance:  Right  Left Main The vessel exhibits minimal luminal irregularities.  Left Anterior Descending The vessel exhibits minimal luminal irregularities. Prox LAD lesion is 40% stenosed.  First Diagonal Branch 1st Diag lesion is 70% stenosed.  Second Diagonal Branch 2nd Diag lesion is 90% stenosed.  Ramus Intermedius Vessel is large. The vessel exhibits minimal luminal irregularities.  Left Circumflex  First Obtuse Marginal Branch 1st Mrg lesion is 100% stenosed. CTO, small vessel  Right Coronary Artery Vessel is large. The vessel exhibits minimal luminal irregularities. Prox RCA lesion is 30% stenosed.  Intervention  No interventions have been documented.     ECHOCARDIOGRAM  ECHOCARDIOGRAM COMPLETE 10/31/2023  Narrative ECHOCARDIOGRAM REPORT    Patient Name:   Rodney Patterson Date of Exam: 10/31/2023 Medical Rec #:  [id number]       Height:       71.5 in Accession #:    [id number]      Weight:       174.6 lb Date of Birth:  06-18-1932      BSA:          2.001 m Patient Age:    89 years        BP:           155/81 mmHg Patient Gender: M               HR:           49 bpm. Exam Location:  [address]  Procedure: 2D Echo, 3D Echo, Cardiac Doppler and Color Doppler (Both Spectral and Color Flow Doppler were utilized during procedure).  Indications:    I35.0 Aortic Stenosis  History:        Patient has prior history of Echocardiogram examinations, most recent 06/01/2022. Carotid Disease and CKD; Risk Factors:Hypertension, Dyslipidemia and HLD. Aortic Valve: 26 mm Edwards S3UR valve is present in the aortic position. Procedure Date: 05/11/2021.  Sonographer:    Waldo Guadalajara RCS Referring Phys: [id number] Zephaniah Lubrano  IMPRESSIONS   1. Left ventricular ejection fraction, by estimation, is 65 to 70%. The left ventricle has normal function. The left ventricle has no regional wall motion abnormalities. Left ventricular diastolic function could not be evaluated. 2. Right  ventricular systolic function is normal. The right ventricular size is normal. There is normal pulmonary artery systolic pressure. 3. The mitral valve is grossly normal. Trivial mitral valve regurgitation. Mild to moderate mitral stenosis. Severe mitral annular calcification. 4. The aortic valve has been repaired/replaced. Aortic valve regurgitation is moderate. There is a 26 mm Edwards S3UR valve present in the aortic position. Procedure Date: 05/11/2021. Echo findings are consistent with perivalvular leak of the aortic prosthesis. Aortic valve mean gradient measures 9.0 mmHg. 5. The inferior vena cava is normal in size with greater than 50% respiratory variability, suggesting right atrial pressure of 3 mmHg.  Comparison(s): Prior images reviewed side by side. Perivalvular leak around posterior aspect of TAVR is now moderate. Mitral stenosis is now mild-moderate.  FINDINGS Left Ventricle: Left ventricular ejection fraction, by estimation, is 65 to 70%. The left ventricle has normal function.  The left ventricle has no regional wall motion abnormalities. The left ventricular internal cavity size was normal in size. There is no left ventricular hypertrophy. Left ventricular diastolic function could not be evaluated due to mitral annular calcification (moderate or greater). Left ventricular diastolic function could not be evaluated.  Right Ventricle: The right ventricular size is normal. No increase in right ventricular wall thickness. Right ventricular systolic function is normal. There is normal pulmonary artery systolic pressure. The tricuspid regurgitant velocity is 1.93 m/s, and with an assumed right atrial pressure of 3 mmHg, the estimated right ventricular systolic pressure is 17.9 mmHg.  Left Atrium: Left atrial size was normal in size.  Right Atrium: Right atrial size was normal in size.  Pericardium: There is no evidence of pericardial effusion.  Mitral Valve: MVA 1.72 cm2 by PHT. The  mitral valve is grossly normal. Severe mitral annular calcification. Trivial mitral valve regurgitation. Mild to moderate mitral valve stenosis.  Tricuspid Valve: The tricuspid valve is normal in structure. Tricuspid valve regurgitation is trivial. No evidence of tricuspid stenosis.  Aortic Valve: The aortic valve has been repaired/replaced. Aortic valve regurgitation is moderate. Aortic regurgitation PHT measures 387 msec. Aortic valve mean gradient measures 9.0 mmHg. Aortic valve peak gradient measures 18.7 mmHg. Aortic valve area, by VTI measures 1.45 cm. There is a 26 mm Edwards S3UR valve present in the aortic position. Procedure Date: 05/11/2021.  Pulmonic Valve: The pulmonic valve was grossly normal. Pulmonic valve regurgitation is trivial. No evidence of pulmonic stenosis.  Aorta: The aortic root, ascending aorta, aortic arch and descending aorta are all structurally normal, with no evidence of dilitation or obstruction.  Venous: The inferior vena cava is normal in size with greater than 50% respiratory variability, suggesting right atrial pressure of 3 mmHg.  IAS/Shunts: The atrial septum is grossly normal.  Additional Comments: 3D was performed not requiring image post processing on an independent workstation and was normal.   LEFT VENTRICLE PLAX 2D LVIDd:         3.80 cm   Diastology LVIDs:         2.40 cm   LV e' medial:    4.79 cm/s LV PW:         1.10 cm   LV E/e' medial:  19.4 LV IVS:        1.00 cm   LV e' lateral:   8.49 cm/s LVOT diam:     1.90 cm   LV E/e' lateral: 10.9 LV SV:         73 LV SV Index:   36 LVOT Area:     2.84 cm  3D Volume EF: 3D EF:        75 % LV EDV:       101 ml LV ESV:       26 ml LV SV:        75 ml  RIGHT VENTRICLE RV Basal diam:  3.80 cm RV S prime:     16.80 cm/s TAPSE (M-mode): 2.3 cm RVSP:           17.9 mmHg  LEFT ATRIUM             Index        RIGHT ATRIUM           Index LA diam:        3.80 cm 1.90 cm/m   RA Pressure:  3.00 mmHg LA Vol (A2C):   31.7 ml 15.84 ml/m  RA Area:  14.10 cm LA Vol (A4C):   47.2 ml 23.59 ml/m  RA Volume:   30.10 ml  15.04 ml/m LA Biplane Vol: 40.3 ml 20.14 ml/m AORTIC VALVE AV Area (Vmax):    1.17 cm AV Area (Vmean):   1.26 cm AV Area (VTI):     1.45 cm AV Vmax:           216.00 cm/s AV Vmean:          143.000 cm/s AV VTI:            0.501 m AV Peak Grad:      18.7 mmHg AV Mean Grad:      9.0 mmHg LVOT Vmax:         89.40 cm/s LVOT Vmean:        63.500 cm/s LVOT VTI:          0.257 m LVOT/AV VTI ratio: 0.51 AI PHT:            387 msec  AORTA Ao Root diam: 3.40 cm Ao Asc diam:  3.70 cm  MITRAL VALVE                TRICUSPID VALVE TR Peak grad:   14.9 mmHg MV Decel Time:              TR Vmax:        193.00 cm/s MV E velocity: 92.80 cm/s   Estimated RAP:  3.00 mmHg MV A velocity: 154.00 cm/s  RVSP:           17.9 mmHg MV E/A ratio:  0.60 SHUNTS Systemic VTI:  0.26 m Systemic Diam: 1.90 cm  Shelda Bruckner MD Electronically signed by Shelda Bruckner MD Signature Date/Time: 10/31/2023/5:02:34 PM    Final    MONITORS  LONG TERM MONITOR (3-14 DAYS) 11/17/2022  Narrative Patch Wear Time:  3 days and 0 hours (2024-07-07T11:34:46-0400 to 2024-07-10T11:45:38-0400)  Patient had a min HR of 42 bpm, max HR of 169 bpm, and avg HR of 57 bpm. Predominant underlying rhythm was Sinus Rhythm. 8 Supraventricular Tachycardia runs occurred, the run with the fastest interval lasting 5 beats with a max rate of 169 bpm, the longest lasting 6 beats with an avg rate of 100 bpm. Isolated SVEs were rare (<1.0%), SVE Couplets were rare (<1.0%), and SVE Triplets were rare (<1.0%). Isolated VEs were rare (<1.0%), and no VE Couplets or VE Triplets were present.  SUMMARY: The basic rhythm is sinus bradycardia with an average HR of 57 bpm No atrial fibrillation or flutter No high-grade heart block or pathologic pauses There are rare PVC's and rare supraventricular  beats with short supraventricular runs up to 5-6 beats   CT SCANS  CT CORONARY MORPH W/CTA COR W/SCORE 04/22/2021  Addendum 04/22/2021  7:25 PM ADDENDUM REPORT: 04/22/2021 19:23  CLINICAL DATA:  15F with severe aortic stenosis being evaluated for a TAVR procedure.  EXAM: Cardiac TAVR CT  TECHNIQUE: The patient was scanned on a [employer]. A 120 kV retrospective scan was triggered in the descending thoracic aorta at 111 HU's. Gantry rotation speed was 250 msecs and collimation was .6 mm. No beta blockade or nitro were given. The 3D data set was reconstructed in 5% intervals of the R-R cycle. Systolic and diastolic phases were analyzed on a dedicated work station using MPR, MIP and VRT modes. The patient received 80 cc of contrast.  FINDINGS: Aortic Root:  Aortic valve: Tricuspid  Aortic valve calcium score: 4635  Aortic annulus:  Diameter: 30mm x 22mm  Perimeter: 82mm  Area: 503 mm^2  Calcifications: Moderate calcification adjacent to noncoronary cusp  Coronary height: Min Left - 21mm, Max Left - 28mm; Min Right - 19mm  Sinotubular height: Left cusp - 28mm; Right cusp - 23mm; Noncoronary cusp - 21mm  LVOT (as measured 3 mm below the annulus):  Diameter: 32mm x 19mm  Area: 463 mm^2  Calcifications: Calcification inferior to noncoronary cusp  Aortic sinus width: Left cusp - 33mm; Right cusp - 33mm; Noncoronary cusp - 34mm  Sinotubular junction width: 31mm x 30mm  Optimum Fluoroscopic Angle for Delivery: LAO 4 CAU 24  Cardiac:  Right atrium: Mild enlargement  Right ventricle: Mild enlargement  Pulmonary arteries: Normal size  Pulmonary veins: Normal configuration  Left atrium: Mild enlargement  Left ventricle: Normal size  Pericardium: Normal thickness  Coronary arteries: Coronary calcium score 389  IMPRESSION: 1. Trileafet aortic valve with severe calcifications (AV calcium score 4635)  2. Aortic annulus measures 30mm x 22mm  in diameter with perimeter 82mm and area 503 mm^2. Moderate annular calcification adjacent to noncoronary cusp and extending into LVOT. Annular measurements suitable for delivery of 26mm Edwards Sapien 3 valve  3. Sufficient coronary to annulus distance, measuring 21mm to left main and 19mm to RCA  4. Optimum Fluoroscopic Angle for Delivery:  LAO 4 CAU 24  5. Coronary calcium score 389   Electronically Signed By: Lonni Nanas M.D. On: 04/22/2021 19:23  Narrative EXAM: OVER-READ INTERPRETATION  CT CHEST  The following report is an over-read performed by radiologist Dr. Selinda Blue of [facility] on 04/22/2021. This over-read does not include interpretation of cardiac or coronary anatomy or pathology. The coronary CTA interpretation by the cardiologist is attached.  COMPARISON:  None.  FINDINGS: Please see the separate concurrent chest CT angiogram report for details.  IMPRESSION: Please see the separate concurrent chest CT angiogram report for details.  Electronically Signed: By: Selinda DELENA Blue M.D. On: 04/22/2021 14:29     ______________________________________________________________________________________________      EKG:   EKG Interpretation Date/Time:  Wednesday December 20 2023 13:07:44 EDT Ventricular Rate:  58 PR Interval:  200 QRS Duration:  84 QT Interval:  436 QTC Calculation: 428 R Axis:   16  Text Interpretation: Sinus bradycardia with Premature supraventricular complexes Possible Inferior infarct , age undetermined Cannot rule out Anterior infarct , age undetermined When compared with ECG of 31-Oct-2022 10:43, Premature supraventricular complexes are now Present Confirmed by Wonda Sharper [phone]) on 12/20/2023 1:12:09 PM    Recent Labs: No results found for requested labs within last 365 days.  Recent Lipid Panel No results found for: CHOL, TRIG, HDL, CHOLHDL, VLDL, LDLCALC, LDLDIRECT   Risk  Assessment/Calculations:                Physical Exam:    VS:  BP 122/78 (BP Location: Left Arm, Patient Position: Sitting, Cuff Size: Normal)   Pulse (!) 55   Resp 16   Ht 5' 11 (1.803 m)   Wt 183 lb (83 kg)   SpO2 97%   BMI 25.52 kg/m     Wt Readings from Last 3 Encounters:  12/20/23 183 lb (83 kg)  10/31/22 174 lb 9.6 oz (79.2 kg)  06/01/22 174 lb 12.8 oz (79.3 kg)     GEN:  Well nourished, well developed in no acute distress HEENT: Normal NECK: No JVD; No carotid bruits LYMPHATICS: No lymphadenopathy CARDIAC: RRR, 2/6 early ejection murmur at the right upper sternal  border and 1/6 diastolic decrescendo murmur at the left lower sternal border RESPIRATORY:  Clear to auscultation without rales, wheezing or rhonchi  ABDOMEN: Soft, non-tender, non-distended MUSCULOSKELETAL:  No edema; No deformity  SKIN: Warm and dry NEUROLOGIC:  Alert and oriented x 3 PSYCHIATRIC:  Normal affect   Assessment & Plan S/P TAVR (transcatheter aortic valve replacement) Patient clinically stable/asymptomatic post TAVR.  He has mild to moderate paravalvular regurgitation with no change in LV function, normal diastolic blood pressure on exam, and a very soft murmur, most suggestive of only mild aortic insufficiency.  Recommend repeat echocardiogram next year.  He follows SBE prophylaxis as recommended. Essential hypertension Blood pressure well-controlled.  Amlodipine  reduced from 10 mg down to 5 mg last year.  I reviewed his blood pressures which are ranging in the 120/70's.  He will continue current management.      Medication Adjustments/Labs and Tests Ordered: Current medicines are reviewed at length with the patient today.  Concerns regarding medicines are outlined above.  Orders Placed This Encounter  Procedures   EKG 12-Lead   ECHOCARDIOGRAM COMPLETE   No orders of the defined types were placed in this encounter.   Patient Instructions  Medication Instructions:  No medication  changes were made at this visit. Continue current regimen.   *If you need a refill on your cardiac medications before your next appointment, please call your pharmacy*  Lab Work: None ordered today. If you have labs (blood work) drawn today and your tests are completely normal, you will receive your results only by: MyChart Message (if you have MyChart) OR A paper copy in the mail If you have any lab test that is abnormal or we need to change your treatment, we will call you to review the results.  Testing/Procedures: Your physician has requested that you have an echocardiogram to be completed 1 week prior to your follow-up with Dr. Wonda. Echocardiography is a painless test that uses sound waves to create images of your heart. It provides your doctor with information about the size and shape of your heart and how well your heart's chambers and valves are working. This procedure takes approximately one hour. There are no restrictions for this procedure. Please do NOT wear cologne, perfume, aftershave, or lotions (deodorant is allowed). Please arrive 15 minutes prior to your appointment time.  Please note: We ask at that you not bring children with you during ultrasound (echo/ vascular) testing. Due to room size and safety concerns, children are not allowed in the ultrasound rooms during exams. Our front office staff cannot provide observation of children in our lobby area while testing is being conducted. An adult accompanying a patient to their appointment will only be allowed in the ultrasound room at the discretion of the ultrasound technician under special circumstances. We apologize for any inconvenience.   Follow-Up: At [facility], you and your health needs are our priority.  As part of our continuing mission to provide you with exceptional heart care, our providers are all part of one team.  This team includes your primary Cardiologist (physician) and Advanced Practice Providers  or APPs (Physician Assistants and Nurse Practitioners) who all work together to provide you with the care you need, when you need it.  Your next appointment:   1 year(s)  Provider:   Ozell Wonda, MD      Signed, Ozell Wonda, MD  12/20/2023 4:27 PM    [facility]

## 2023-12-20 NOTE — Patient Instructions (Signed)
 Medication Instructions:  No medication changes were made at this visit. Continue current regimen.   *If you need a refill on your cardiac medications before your next appointment, please call your pharmacy*  Lab Work: None ordered today. If you have labs (blood work) drawn today and your tests are completely normal, you will receive your results only by: MyChart Message (if you have MyChart) OR A paper copy in the mail If you have any lab test that is abnormal or we need to change your treatment, we will call you to review the results.  Testing/Procedures: Your physician has requested that you have an echocardiogram to be completed 1 week prior to your follow-up with Dr. Wonda. Echocardiography is a painless test that uses sound waves to create images of your heart. It provides your doctor with information about the size and shape of your heart and how well your heart's chambers and valves are working. This procedure takes approximately one hour. There are no restrictions for this procedure. Please do NOT wear cologne, perfume, aftershave, or lotions (deodorant is allowed). Please arrive 15 minutes prior to your appointment time.  Please note: We ask at that you not bring children with you during ultrasound (echo/ vascular) testing. Due to room size and safety concerns, children are not allowed in the ultrasound rooms during exams. Our front office staff cannot provide observation of children in our lobby area while testing is being conducted. An adult accompanying a patient to their appointment will only be allowed in the ultrasound room at the discretion of the ultrasound technician under special circumstances. We apologize for any inconvenience.   Follow-Up: At [facility], you and your health needs are our priority.  As part of our continuing mission to provide you with exceptional heart care, our providers are all part of one team.  This team includes your primary Cardiologist  (physician) and Advanced Practice Providers or APPs (Physician Assistants and Nurse Practitioners) who all work together to provide you with the care you need, when you need it.  Your next appointment:   1 year(s)  Provider:   Ozell Wonda, MD

## 2023-12-25 ENCOUNTER — Other Ambulatory Visit: Payer: Self-pay | Admitting: Cardiovascular Disease

## 2023-12-25 ENCOUNTER — Telehealth: Payer: Self-pay | Admitting: Cardiovascular Disease

## 2023-12-25 NOTE — Telephone Encounter (Signed)
 Pt of Dr. Wonda requesting a refill of Zyrtec . Please advise on a refill.

## 2023-12-25 NOTE — Telephone Encounter (Signed)
*  STAT* If patient is at the pharmacy, call can be transferred to refill team.   1. Which medications need to be refilled? (please list name of each medication and dose if known) cetirizine  (ZYRTEC ) 10 MG tablet    2. Would you like to learn more about the convenience, safety, & potential cost savings by using the [facility]? No   3. Are you open to using the [facility] (Type [facility]. ). No   4. Which pharmacy/location (including street and city if local pharmacy) is medication to be sent to? [id number] - [place], [place] - [address]     5. Do they need a 30 day or 90 day supply? 90 day

## 2024-02-02 ENCOUNTER — Other Ambulatory Visit: Payer: Self-pay | Admitting: Cardiovascular Disease

## 2024-12-02 ENCOUNTER — Other Ambulatory Visit (HOSPITAL_COMMUNITY)
